# Patient Record
Sex: Female | Born: 1945
Health system: Southern US, Community
[De-identification: ages and names within clinical notes are randomized; demographics above are authoritative.]

## PROBLEM LIST (undated history)

## (undated) DIAGNOSIS — M1990 Unspecified osteoarthritis, unspecified site: Secondary | ICD-10-CM

## (undated) DIAGNOSIS — T7840XA Allergy, unspecified, initial encounter: Secondary | ICD-10-CM

## (undated) DIAGNOSIS — I10 Essential (primary) hypertension: Secondary | ICD-10-CM

## (undated) DIAGNOSIS — H269 Unspecified cataract: Secondary | ICD-10-CM

## (undated) HISTORY — DX: Unspecified osteoarthritis, unspecified site: M19.90

## (undated) HISTORY — PX: ABDOMINAL HYSTERECTOMY: SHX81

## (undated) HISTORY — DX: Essential (primary) hypertension: I10

## (undated) HISTORY — PX: BREAST BIOPSY: SHX20

## (undated) HISTORY — PX: EYE SURGERY: SHX253

## (undated) HISTORY — DX: Allergy, unspecified, initial encounter: T78.40XA

## (undated) HISTORY — DX: Unspecified cataract: H26.9

## (undated) HISTORY — PX: CHOLECYSTECTOMY: SHX55

---

## 2006-01-01 ENCOUNTER — Ambulatory Visit: Payer: Self-pay | Admitting: Family Medicine

## 2006-01-03 ENCOUNTER — Ambulatory Visit: Payer: Self-pay | Admitting: Family Medicine

## 2006-06-17 ENCOUNTER — Ambulatory Visit: Payer: Self-pay | Admitting: Family Medicine

## 2012-04-01 ENCOUNTER — Telehealth (INDEPENDENT_AMBULATORY_CARE_PROVIDER_SITE_OTHER): Payer: Self-pay

## 2012-05-21 NOTE — Telephone Encounter (Signed)
No notes in this encounter. 

## 2015-08-08 ENCOUNTER — Telehealth: Payer: Self-pay

## 2015-08-08 NOTE — Telephone Encounter (Signed)
Pts caregiver called to have us send a D/C order for the Synthroid. I advised her that this was sent to the pharmacy as requested and notified her that she would need to contact the pharmacy.

## 2015-12-08 DIAGNOSIS — M7751 Other enthesopathy of right foot: Secondary | ICD-10-CM | POA: Insufficient documentation

## 2016-06-15 DIAGNOSIS — J309 Allergic rhinitis, unspecified: Secondary | ICD-10-CM | POA: Insufficient documentation

## 2017-07-05 ENCOUNTER — Encounter (HOSPITAL_COMMUNITY): Payer: Self-pay | Admitting: Emergency Medicine

## 2017-07-05 ENCOUNTER — Emergency Department (HOSPITAL_COMMUNITY)
Admission: EM | Admit: 2017-07-05 | Discharge: 2017-07-05 | Disposition: A | Discharge status: Home / Self Care | Payer: No Typology Code available for payment source | Attending: Emergency Medicine | Admitting: Emergency Medicine

## 2017-07-05 ENCOUNTER — Emergency Department (HOSPITAL_COMMUNITY): Payer: No Typology Code available for payment source

## 2017-07-05 DIAGNOSIS — Z79899 Other long term (current) drug therapy: Secondary | ICD-10-CM | POA: Diagnosis not present

## 2017-07-05 DIAGNOSIS — Y9389 Activity, other specified: Secondary | ICD-10-CM | POA: Diagnosis not present

## 2017-07-05 DIAGNOSIS — R51 Headache: Secondary | ICD-10-CM | POA: Diagnosis not present

## 2017-07-05 DIAGNOSIS — Y998 Other external cause status: Secondary | ICD-10-CM | POA: Insufficient documentation

## 2017-07-05 DIAGNOSIS — S2241XA Multiple fractures of ribs, right side, initial encounter for closed fracture: Secondary | ICD-10-CM | POA: Insufficient documentation

## 2017-07-05 DIAGNOSIS — Y92410 Unspecified street and highway as the place of occurrence of the external cause: Secondary | ICD-10-CM | POA: Diagnosis not present

## 2017-07-05 DIAGNOSIS — R1011 Right upper quadrant pain: Secondary | ICD-10-CM | POA: Diagnosis not present

## 2017-07-05 DIAGNOSIS — Z91040 Latex allergy status: Secondary | ICD-10-CM | POA: Insufficient documentation

## 2017-07-05 DIAGNOSIS — Z7982 Long term (current) use of aspirin: Secondary | ICD-10-CM | POA: Diagnosis not present

## 2017-07-05 DIAGNOSIS — S299XXA Unspecified injury of thorax, initial encounter: Secondary | ICD-10-CM | POA: Diagnosis present

## 2017-07-05 DIAGNOSIS — S2221XA Fracture of manubrium, initial encounter for closed fracture: Secondary | ICD-10-CM | POA: Diagnosis not present

## 2017-07-05 DIAGNOSIS — Z9049 Acquired absence of other specified parts of digestive tract: Secondary | ICD-10-CM | POA: Diagnosis not present

## 2017-07-05 LAB — COMPREHENSIVE METABOLIC PANEL
ALK PHOS: 41 U/L (ref 38–126)
ALT: 26 U/L (ref 14–54)
AST: 64 U/L — ABNORMAL HIGH (ref 15–41)
Albumin: 3.2 g/dL — ABNORMAL LOW (ref 3.5–5.0)
Anion gap: 7 (ref 5–15)
BUN: 34 mg/dL — ABNORMAL HIGH (ref 6–20)
CHLORIDE: 108 mmol/L (ref 101–111)
CO2: 24 mmol/L (ref 22–32)
CREATININE: 0.9 mg/dL (ref 0.44–1.00)
Calcium: 8.6 mg/dL — ABNORMAL LOW (ref 8.9–10.3)
GFR calc non Af Amer: >60 mL/min (ref >60)
GLUCOSE: 218 mg/dL — AB (ref 65–99)
Potassium: 4.1 mmol/L (ref 3.5–5.1)
SODIUM: 139 mmol/L (ref 135–145)
Total Bilirubin: 0.7 mg/dL (ref 0.3–1.2)
Total Protein: 6.3 g/dL — ABNORMAL LOW (ref 6.5–8.1)

## 2017-07-05 LAB — CBC WITH DIFFERENTIAL/PLATELET
Abs Immature Granulocytes: 0.1 10*3/uL (ref 0.0–0.1)
BASOS PCT: 0 %
Basophils Absolute: 0 10*3/uL (ref 0.0–0.1)
EOS ABS: 0.1 10*3/uL (ref 0.0–0.7)
EOS PCT: 1 %
HEMATOCRIT: 34.3 % — AB (ref 36.0–46.0)
Hemoglobin: 11.1 g/dL — ABNORMAL LOW (ref 12.0–15.0)
Immature Granulocytes: 1 %
LYMPHS ABS: 1.4 10*3/uL (ref 0.7–4.0)
Lymphocytes Relative: 11 %
MCH: 30.1 pg (ref 26.0–34.0)
MCHC: 32.4 g/dL (ref 30.0–36.0)
MCV: 93 fL (ref 78.0–100.0)
MONOS PCT: 5 %
Monocytes Absolute: 0.7 10*3/uL (ref 0.1–1.0)
NEUTROS PCT: 82 %
Neutro Abs: 9.9 10*3/uL — ABNORMAL HIGH (ref 1.7–7.7)
PLATELETS: 182 10*3/uL (ref 150–400)
RBC: 3.69 MIL/uL — ABNORMAL LOW (ref 3.87–5.11)
RDW: 14.2 % (ref 11.5–15.5)
WBC: 12.2 10*3/uL — ABNORMAL HIGH (ref 4.0–10.5)

## 2017-07-05 LAB — TYPE AND SCREEN
ABO/RH(D): O POS
Antibody Screen: NEGATIVE

## 2017-07-05 LAB — PROTIME-INR
INR: 1.04
Prothrombin Time: 13.5 seconds (ref 11.4–15.2)

## 2017-07-05 LAB — I-STAT CHEM 8, ED
BUN: 37 mg/dL — AB (ref 6–20)
CREATININE: 0.7 mg/dL (ref 0.44–1.00)
Calcium, Ion: 1.12 mmol/L — ABNORMAL LOW (ref 1.15–1.40)
Chloride: 106 mmol/L (ref 101–111)
GLUCOSE: 214 mg/dL — AB (ref 65–99)
HEMATOCRIT: 33 % — AB (ref 36.0–46.0)
Hemoglobin: 11.2 g/dL — ABNORMAL LOW (ref 12.0–15.0)
POTASSIUM: 4 mmol/L (ref 3.5–5.1)
Sodium: 141 mmol/L (ref 135–145)
TCO2: 25 mmol/L (ref 22–32)

## 2017-07-05 LAB — ABO/RH: ABO/RH(D): O POS

## 2017-07-05 MED ORDER — CYCLOBENZAPRINE HCL 10 MG PO TABS: 10.0000 mg | ORAL_TABLET | Freq: Three times a day (TID) | ORAL | 0 refills | Status: DC | PRN

## 2017-07-05 MED ORDER — IOHEXOL 300 MG/ML  SOLN
100.0000 mL | Freq: Once | INTRAMUSCULAR | Status: AC
  Administered 2017-07-05: 100 mL via INTRAVENOUS

## 2017-07-05 MED ORDER — MORPHINE SULFATE (PF) 4 MG/ML IV SOLN
4.0000 mg | Freq: Once | INTRAVENOUS | Status: AC
  Administered 2017-07-05: 4 mg via INTRAVENOUS
  Filled 2017-07-05: qty 1

## 2017-07-05 MED ORDER — OXYCODONE-ACETAMINOPHEN 5-325 MG PO TABS: 1.0000 | ORAL_TABLET | ORAL | 0 refills | Status: DC | PRN

## 2017-07-05 MED ORDER — HYDROMORPHONE HCL 2 MG/ML IJ SOLN: 0.5000 mg | Freq: Once | INTRAMUSCULAR | Status: DC
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
## 2017-07-05 NOTE — ED Notes (Signed)
Patient belongings given to patient family
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
## 2017-07-05 NOTE — ED Notes (Signed)
Patient transported to CT 

## 2017-07-05 NOTE — Discharge Instructions (Addendum)
You have a 1.3 cm RIGHT THYROID NODULE that needs follow up with a thyroid Ultrasound  You have dilation of your COMMONBILE DUCT to 1.6cm and will benefit from GI consultation

## 2017-08-06 DIAGNOSIS — E041 Nontoxic single thyroid nodule: Secondary | ICD-10-CM | POA: Insufficient documentation

## 2018-09-17 ENCOUNTER — Other Ambulatory Visit: Payer: Self-pay

## 2018-09-18 ENCOUNTER — Ambulatory Visit (INDEPENDENT_AMBULATORY_CARE_PROVIDER_SITE_OTHER): Payer: Medicare HMO | Admitting: Physician Assistant

## 2018-09-18 ENCOUNTER — Encounter: Payer: Self-pay | Admitting: Physician Assistant

## 2018-09-18 VITALS — BP 130/78 | HR 84 | Temp 97.1°F | Ht 68.0 in | Wt 163.0 lb

## 2018-09-18 DIAGNOSIS — I10 Essential (primary) hypertension: Secondary | ICD-10-CM

## 2018-09-18 NOTE — Patient Instructions (Signed)

## 2018-09-18 NOTE — Progress Notes (Signed)
  Subjective:     Patient ID: Theresa Gamble, female   DOB: January 10, 1946, 73 y.o.   MRN: [id number]  HPI Pt here as new pt Prev seen by Dr Edrick Oh for HTN States she has been doing well Denies CP,SOB, lower ext edema or change in endurance Has noticed that her BP readings sometimes will be in the sys readings of 90's and at that time she may feel a little lightheaded Currently taking Zestril 5mg  and HCTZ 12.5 mg  Review of Systems  Constitutional: Negative.   Respiratory: Negative.   Cardiovascular: Negative.        Objective:   Physical Exam Vitals signs and nursing note reviewed.  Constitutional:      General: She is not in acute distress.    Appearance: Normal appearance. She is normal weight. She is not ill-appearing.  Neck:     Vascular: No carotid bruit.  Cardiovascular:     Rate and Rhythm: Normal rate and regular rhythm.     Pulses: Normal pulses.     Heart sounds: Normal heart sounds. No murmur.  Pulmonary:     Effort: Pulmonary effort is normal.     Breath sounds: Normal breath sounds.  Musculoskeletal:     Right lower leg: No edema.     Left lower leg: No edema.  Lymphadenopathy:     Cervical: No cervical adenopathy.  Neurological:     Mental Status: She is alert.        Assessment:     1. Hypertension, unspecified type        Plan:     Since she is getting the lower reading with sx will have her stop the HCTZ for now She has a BP cuff at home so would like for her to get regular readings over the next month Hydrate well She is very active around the house so would like for her to keep this up F/U in 4-6 weeks for eval of BP and labs

## 2018-11-06 ENCOUNTER — Other Ambulatory Visit: Payer: Self-pay

## 2018-11-07 ENCOUNTER — Encounter: Payer: Self-pay | Admitting: Family Medicine

## 2018-11-07 ENCOUNTER — Ambulatory Visit (INDEPENDENT_AMBULATORY_CARE_PROVIDER_SITE_OTHER): Payer: Medicare HMO | Admitting: Family Medicine

## 2018-11-07 VITALS — BP 124/79 | HR 93 | Temp 96.9°F | Resp 20 | Ht 68.0 in | Wt 159.6 lb

## 2018-11-07 DIAGNOSIS — R739 Hyperglycemia, unspecified: Secondary | ICD-10-CM | POA: Diagnosis not present

## 2018-11-07 DIAGNOSIS — I10 Essential (primary) hypertension: Secondary | ICD-10-CM

## 2018-11-07 DIAGNOSIS — Z13220 Encounter for screening for lipoid disorders: Secondary | ICD-10-CM

## 2018-11-07 DIAGNOSIS — Z131 Encounter for screening for diabetes mellitus: Secondary | ICD-10-CM

## 2018-11-07 LAB — BAYER DCA HB A1C WAIVED: HB A1C (BAYER DCA - WAIVED): 6 % (ref <7.0)

## 2018-11-07 MED ORDER — HYDROCHLOROTHIAZIDE 12.5 MG PO TABS: 12.5000 mg | ORAL_TABLET | Freq: Every day | ORAL | 3 refills | Status: DC

## 2018-11-07 NOTE — Progress Notes (Signed)
BP 124/79   Pulse 93   Temp (!) 96.9 F (36.1 C) (Temporal)   Resp 20   Ht '5\' 8"'$  (1.727 m)   Wt 159 lb 9.6 oz (72.4 kg)   SpO2 99%   BMI 24.27 kg/m    Subjective:   Patient ID: Theresa Gamble, female    DOB: 06/24/1945, 73 y.o.   MRN: [id number]  HPI: Theresa Gamble is a 73 y.o. female presenting on 11/07/2018 for Hypertension (six week recheck)   HPI Hypertension Patient is currently on hydrochlorothiazide and lisinopril, her blood pressures are running on the lower end, she is getting some lightheadedness and dizziness, she was instructed on her last visit to stop hydrochlorothiazide but that she got swelling so she started it back up a week ago, and their blood pressure today is 124/79.Patient denies headaches, blurred vision, chest pains, shortness of breath, or weakness. Denies any side effects from medication and is content with current medication.   Relevant past medical, surgical, family and social history reviewed and updated as indicated. Interim medical history since our last visit reviewed. Allergies and medications reviewed and updated.  Review of Systems  Constitutional: Negative for chills and fever.  HENT: Negative for congestion, ear discharge and ear pain.   Eyes: Negative for visual disturbance.  Respiratory: Negative for chest tightness and shortness of breath.   Cardiovascular: Negative for chest pain and leg swelling.  Genitourinary: Negative for difficulty urinating and dysuria.  Musculoskeletal: Negative for back pain and gait problem.  Skin: Negative for rash.  Neurological: Positive for dizziness and light-headedness. Negative for headaches.  Psychiatric/Behavioral: Negative for agitation and behavioral problems.  All other systems reviewed and are negative.   Per HPI unless specifically indicated above   Allergies as of 11/07/2018      Reactions   Latex Rash      Medication List       Accurate as of November 07, 2018 11:25 AM. If you have  any questions, ask your nurse or doctor.        STOP taking these medications   lisinopril 5 MG tablet Commonly known as: ZESTRIL Stopped by: Fransisca Kaufmann Tiffney Haughton, MD     TAKE these medications   aspirin EC 81 MG tablet Take 81 mg by mouth daily.   calcium carbonate 1500 (600 Ca) MG Tabs tablet Commonly known as: OSCAL Take 600 mg of elemental calcium by mouth daily with breakfast.   hydrochlorothiazide 12.5 MG tablet Commonly known as: HYDRODIURIL Take 1 tablet (12.5 mg total) by mouth daily. What changed:   how much to take  when to take this Changed by: Worthy Rancher, MD   multivitamin tablet Take 1 tablet by mouth daily.        Objective:   BP 124/79   Pulse 93   Temp (!) 96.9 F (36.1 C) (Temporal)   Resp 20   Ht '5\' 8"'$  (1.727 m)   Wt 159 lb 9.6 oz (72.4 kg)   SpO2 99%   BMI 24.27 kg/m   Wt Readings from Last 3 Encounters:  11/07/18 159 lb 9.6 oz (72.4 kg)  09/18/18 163 lb (73.9 kg)  07/05/17 158 lb (71.7 kg)    Physical Exam Vitals signs and nursing note reviewed.  Constitutional:      General: She is not in acute distress.    Appearance: She is well-developed. She is not diaphoretic.  Eyes:     Conjunctiva/sclera: Conjunctivae normal.  Cardiovascular:     Rate  and Rhythm: Normal rate and regular rhythm.     Heart sounds: Normal heart sounds. No murmur.  Pulmonary:     Effort: Pulmonary effort is normal. No respiratory distress.     Breath sounds: Normal breath sounds. No wheezing.  Musculoskeletal: Normal range of motion.        General: No tenderness.  Skin:    General: Skin is warm and dry.     Findings: No rash.  Neurological:     Mental Status: She is alert and oriented to person, place, and time.     Coordination: Coordination normal.  Psychiatric:        Behavior: Behavior normal.     Assessment & Plan:   Problem List Items Addressed This Visit      Cardiovascular and Mediastinum   Hypertension   Relevant Medications    hydrochlorothiazide (HYDRODIURIL) 12.5 MG tablet   Other Relevant Orders   CMP14+EGFR   CBC with Differential/Platelet    Other Visit Diagnoses    Diabetes mellitus screening    -  Primary   Relevant Orders   Bayer DCA Hb A1c Waived   CBC with Differential/Platelet   Lipid screening       Relevant Orders   Lipid panel      Stop lisinopril and keep hydrochlorothiazide because patient prefers to have it because of swelling that she gets sometimes Follow up plan: Return in about 6 weeks (around 12/19/2018), or if symptoms worsen or fail to improve, for Hypertension recheck.  Counseling provided for all of the vaccine components Orders Placed This Encounter  Procedures  . Bayer DCA Hb A1c Waived  . CMP14+EGFR  . CBC with Differential/Platelet  . Lipid panel    Caryl Pina, MD [facility] 11/07/2018, 11:25 AM

## 2018-11-08 LAB — CBC WITH DIFFERENTIAL/PLATELET
Basophils Absolute: 0 10*3/uL (ref 0.0–0.2)
Basos: 1 %
EOS (ABSOLUTE): 0.1 10*3/uL (ref 0.0–0.4)
Eos: 3 %
Hematocrit: 37.6 % (ref 34.0–46.6)
Hemoglobin: 12.7 g/dL (ref 11.1–15.9)
Immature Grans (Abs): 0 10*3/uL (ref 0.0–0.1)
Immature Granulocytes: 0 %
Lymphocytes Absolute: 1.6 10*3/uL (ref 0.7–3.1)
Lymphs: 52 %
MCH: 31 pg (ref 26.6–33.0)
MCHC: 33.8 g/dL (ref 31.5–35.7)
MCV: 92 fL (ref 79–97)
Monocytes Absolute: 0.5 10*3/uL (ref 0.1–0.9)
Monocytes: 14 %
Neutrophils Absolute: 0.9 10*3/uL — ABNORMAL LOW (ref 1.4–7.0)
Neutrophils: 30 %
Platelets: 265 10*3/uL (ref 150–450)
RBC: 4.1 x10E6/uL (ref 3.77–5.28)
RDW: 12.7 % (ref 11.7–15.4)
WBC: 3.1 10*3/uL — ABNORMAL LOW (ref 3.4–10.8)

## 2018-11-08 LAB — LIPID PANEL
Chol/HDL Ratio: 2.6 ratio (ref 0.0–4.4)
Cholesterol, Total: 180 mg/dL (ref 100–199)
HDL: 70 mg/dL (ref >39)
LDL Chol Calc (NIH): 99 mg/dL (ref 0–99)
Triglycerides: 59 mg/dL (ref 0–149)
VLDL Cholesterol Cal: 11 mg/dL (ref 5–40)

## 2018-11-08 LAB — CMP14+EGFR
ALT: 9 IU/L (ref 0–32)
AST: 18 IU/L (ref 0–40)
Albumin/Globulin Ratio: 1.4 (ref 1.2–2.2)
Albumin: 4.3 g/dL (ref 3.7–4.7)
Alkaline Phosphatase: 59 IU/L (ref 39–117)
BUN/Creatinine Ratio: 29 — ABNORMAL HIGH (ref 12–28)
BUN: 22 mg/dL (ref 8–27)
Bilirubin Total: 0.2 mg/dL (ref 0.0–1.2)
CO2: 22 mmol/L (ref 20–29)
Calcium: 9.9 mg/dL (ref 8.7–10.3)
Chloride: 103 mmol/L (ref 96–106)
Creatinine, Ser: 0.75 mg/dL (ref 0.57–1.00)
GFR calc Af Amer: 93 mL/min/{1.73_m2} (ref >59)
GFR calc non Af Amer: 80 mL/min/{1.73_m2} (ref >59)
Globulin, Total: 3.1 g/dL (ref 1.5–4.5)
Glucose: 105 mg/dL — ABNORMAL HIGH (ref 65–99)
Potassium: 4.8 mmol/L (ref 3.5–5.2)
Sodium: 140 mmol/L (ref 134–144)
Total Protein: 7.4 g/dL (ref 6.0–8.5)

## 2018-12-22 ENCOUNTER — Ambulatory Visit: Payer: Self-pay | Admitting: Family Medicine

## 2018-12-25 ENCOUNTER — Other Ambulatory Visit: Payer: Self-pay

## 2018-12-26 ENCOUNTER — Ambulatory Visit (INDEPENDENT_AMBULATORY_CARE_PROVIDER_SITE_OTHER): Payer: Medicare HMO | Admitting: Family Medicine

## 2018-12-26 ENCOUNTER — Encounter: Payer: Self-pay | Admitting: Family Medicine

## 2018-12-26 VITALS — BP 137/86 | HR 86 | Temp 95.9°F | Ht 68.0 in | Wt 162.2 lb

## 2018-12-26 DIAGNOSIS — I10 Essential (primary) hypertension: Secondary | ICD-10-CM

## 2018-12-26 DIAGNOSIS — E041 Nontoxic single thyroid nodule: Secondary | ICD-10-CM

## 2018-12-26 DIAGNOSIS — R7989 Other specified abnormal findings of blood chemistry: Secondary | ICD-10-CM | POA: Diagnosis not present

## 2018-12-26 NOTE — Progress Notes (Signed)
BP 137/86   Pulse 86   Temp (!) 95.9 F (35.5 C) (Temporal)   Ht _0  (1.727 m)   Wt 162 lb 3.2 oz (73.6 kg)   SpO2 94%   BMI 24.66 kg/m    Subjective:   Patient ID: Theresa Gamble, female    DOB: Nov 13, 1945, 73 y.o.   MRN: [id number]  HPI: Theresa Gamble is a 73 y.o. female presenting on 12/26/2018 for Hypertension (6 week re check )   HPI Hypertension Patient is currently on hydrochlorothiazide, and their blood pressure today is 137/86 and at home it looks like she is mostly running in the low teens over 60s. Patient denies any lightheadedness or dizziness. Patient denies headaches, blurred vision, chest pains, shortness of breath, or weakness. Denies any side effects from medication and is content with current medication.   Has a history of a thyroid nodule, will recheck thyroid levels today, thyroid nodule is not palpable on exam  Relevant past medical, surgical, family and social history reviewed and updated as indicated. Interim medical history since our last visit reviewed. Allergies and medications reviewed and updated.  Review of Systems  Constitutional: Negative for chills and fever.  HENT: Negative for congestion, ear discharge and ear pain.   Eyes: Negative for redness and visual disturbance.  Respiratory: Negative for chest tightness and shortness of breath.   Cardiovascular: Negative for chest pain and leg swelling.  Musculoskeletal: Negative for back pain and gait problem.  Skin: Negative for rash.  Neurological: Negative for light-headedness and headaches.  Psychiatric/Behavioral: Negative for agitation and behavioral problems.  All other systems reviewed and are negative.   Per HPI unless specifically indicated above   Allergies as of 12/26/2018      Reactions   Latex Rash      Medication List       Accurate as of December 26, 2018 11:32 AM. If you have any questions, ask your nurse or doctor.        aspirin EC 81 MG tablet Take 81 mg by mouth  daily.   calcium carbonate 1500 (600 Ca) MG Tabs tablet Commonly known as: OSCAL Take 600 mg of elemental calcium by mouth daily with breakfast.   hydrochlorothiazide 12.5 MG tablet Commonly known as: HYDRODIURIL Take 1 tablet (12.5 mg total) by mouth daily.   multivitamin tablet Take 1 tablet by mouth daily.        Objective:   BP 137/86   Pulse 86   Temp (!) 95.9 F (35.5 C) (Temporal)   Ht _1  (1.727 m)   Wt 162 lb 3.2 oz (73.6 kg)   SpO2 94%   BMI 24.66 kg/m   Wt Readings from Last 3 Encounters:  12/26/18 162 lb 3.2 oz (73.6 kg)  11/07/18 159 lb 9.6 oz (72.4 kg)  09/18/18 163 lb (73.9 kg)    Physical Exam Vitals signs and nursing note reviewed.  Constitutional:      General: She is not in acute distress.    Appearance: She is well-developed. She is not diaphoretic.  Eyes:     Conjunctiva/sclera: Conjunctivae normal.  Cardiovascular:     Rate and Rhythm: Normal rate and regular rhythm.     Heart sounds: Normal heart sounds. No murmur.  Pulmonary:     Effort: Pulmonary effort is normal. No respiratory distress.     Breath sounds: Normal breath sounds. No wheezing.  Musculoskeletal: Normal range of motion.        General: No tenderness.  Skin:    General: Skin is warm and dry.     Findings: No rash.  Neurological:     Mental Status: She is alert and oriented to person, place, and time.     Coordination: Coordination normal.  Psychiatric:        Behavior: Behavior normal.     Results for orders placed or performed in visit on 11/07/18  Bayer DCA Hb A1c Waived  Result Value Ref Range   HB A1C (BAYER DCA - WAIVED) 6.0 <7.0 %  CMP14+EGFR  Result Value Ref Range   Glucose 105 (H) 65 - 99 mg/dL   BUN 22 8 - 27 mg/dL   Creatinine, Ser 0.75 0.57 - 1.00 mg/dL   GFR calc non Af Amer 80 >59 mL/min/1.73   GFR calc Af Amer 93 >59 mL/min/1.73   BUN/Creatinine Ratio 29 (H) 12 - 28   Sodium 140 134 - 144 mmol/L   Potassium 4.8 3.5 - 5.2 mmol/L   Chloride  103 96 - 106 mmol/L   CO2 22 20 - 29 mmol/L   Calcium 9.9 8.7 - 10.3 mg/dL   Total Protein 7.4 6.0 - 8.5 g/dL   Albumin 4.3 3.7 - 4.7 g/dL   Globulin, Total 3.1 1.5 - 4.5 g/dL   Albumin/Globulin Ratio 1.4 1.2 - 2.2   Bilirubin Total 0.2 0.0 - 1.2 mg/dL   Alkaline Phosphatase 59 39 - 117 IU/L   AST 18 0 - 40 IU/L   ALT 9 0 - 32 IU/L  CBC with Differential/Platelet  Result Value Ref Range   WBC 3.1 (L) 3.4 - 10.8 x10E3/uL   RBC 4.10 3.77 - 5.28 x10E6/uL   Hemoglobin 12.7 11.1 - 15.9 g/dL   Hematocrit 37.6 34.0 - 46.6 %   MCV 92 79 - 97 fL   MCH 31.0 26.6 - 33.0 pg   MCHC 33.8 31.5 - 35.7 g/dL   RDW 12.7 11.7 - 15.4 %   Platelets 265 150 - 450 x10E3/uL   Neutrophils 30 Not Estab. %   Lymphs 52 Not Estab. %   Monocytes 14 Not Estab. %   Eos 3 Not Estab. %   Basos 1 Not Estab. %   Neutrophils Absolute 0.9 (L) 1.4 - 7.0 x10E3/uL   Lymphocytes Absolute 1.6 0.7 - 3.1 x10E3/uL   Monocytes Absolute 0.5 0.1 - 0.9 x10E3/uL   EOS (ABSOLUTE) 0.1 0.0 - 0.4 x10E3/uL   Basophils Absolute 0.0 0.0 - 0.2 x10E3/uL   Immature Granulocytes 0 Not Estab. %   Immature Grans (Abs) 0.0 0.0 - 0.1 x10E3/uL   Hematology Comments: Note:   Lipid panel  Result Value Ref Range   Cholesterol, Total 180 100 - 199 mg/dL   Triglycerides 59 0 - 149 mg/dL   HDL 70 >39 mg/dL   VLDL Cholesterol Cal 11 5 - 40 mg/dL   LDL Chol Calc (NIH) 99 0 - 99 mg/dL   Chol/HDL Ratio 2.6 0.0 - 4.4 ratio    Assessment & Plan:   Problem List Items Addressed This Visit      Cardiovascular and Mediastinum   Hypertension   Relevant Orders   BMP8+EGFR     Endocrine   Right thyroid nodule - Primary   Relevant Orders   TSH    Continue current medication, will check thyroid as well.  Follow up plan: Return in about 6 months (around 06/25/2019), or if symptoms worsen or fail to improve, for Hypertension.  Counseling provided for all of the vaccine components  Orders Placed This Encounter  Procedures  . TSH  . BMP8+EGFR     Caryl Pina, MD [facility] 12/26/2018, 11:32 AM

## 2018-12-27 LAB — BMP8+EGFR
BUN/Creatinine Ratio: 32 — ABNORMAL HIGH (ref 12–28)
BUN: 20 mg/dL (ref 8–27)
CO2: 25 mmol/L (ref 20–29)
Calcium: 10.4 mg/dL — ABNORMAL HIGH (ref 8.7–10.3)
Chloride: 101 mmol/L (ref 96–106)
Creatinine, Ser: 0.62 mg/dL (ref 0.57–1.00)
GFR calc Af Amer: 105 mL/min/{1.73_m2} (ref >59)
GFR calc non Af Amer: 91 mL/min/{1.73_m2} (ref >59)
Glucose: 89 mg/dL (ref 65–99)
Potassium: 5 mmol/L (ref 3.5–5.2)
Sodium: 141 mmol/L (ref 134–144)

## 2018-12-27 LAB — TSH: TSH: 4.63 u[IU]/mL — ABNORMAL HIGH (ref 0.450–4.500)

## 2018-12-31 NOTE — Addendum Note (Signed)
Addended by: Karle Plumber on: 12/31/2018 03:13 PM   Modules accepted: Orders

## 2019-01-02 ENCOUNTER — Other Ambulatory Visit: Payer: Self-pay

## 2019-01-02 DIAGNOSIS — Z20822 Contact with and (suspected) exposure to covid-19: Secondary | ICD-10-CM

## 2019-01-02 DIAGNOSIS — Z20828 Contact with and (suspected) exposure to other viral communicable diseases: Secondary | ICD-10-CM | POA: Diagnosis not present

## 2019-01-05 LAB — NOVEL CORONAVIRUS, NAA: SARS-CoV-2, NAA: NOT DETECTED

## 2019-01-13 ENCOUNTER — Ambulatory Visit: Payer: Medicare HMO

## 2019-01-13 DIAGNOSIS — Z1231 Encounter for screening mammogram for malignant neoplasm of breast: Secondary | ICD-10-CM | POA: Diagnosis not present

## 2019-01-14 DIAGNOSIS — M9902 Segmental and somatic dysfunction of thoracic region: Secondary | ICD-10-CM | POA: Diagnosis not present

## 2019-01-14 DIAGNOSIS — M9901 Segmental and somatic dysfunction of cervical region: Secondary | ICD-10-CM | POA: Diagnosis not present

## 2019-01-14 DIAGNOSIS — M62830 Muscle spasm of back: Secondary | ICD-10-CM | POA: Diagnosis not present

## 2019-01-14 DIAGNOSIS — M9903 Segmental and somatic dysfunction of lumbar region: Secondary | ICD-10-CM | POA: Diagnosis not present

## 2019-01-19 ENCOUNTER — Ambulatory Visit: Payer: Medicare HMO

## 2019-01-28 ENCOUNTER — Other Ambulatory Visit: Payer: Medicare HMO

## 2019-01-28 ENCOUNTER — Telehealth: Payer: Self-pay

## 2019-01-28 ENCOUNTER — Other Ambulatory Visit: Payer: Self-pay

## 2019-01-28 DIAGNOSIS — R7989 Other specified abnormal findings of blood chemistry: Secondary | ICD-10-CM | POA: Diagnosis not present

## 2019-01-28 DIAGNOSIS — M9902 Segmental and somatic dysfunction of thoracic region: Secondary | ICD-10-CM | POA: Diagnosis not present

## 2019-01-28 DIAGNOSIS — M62830 Muscle spasm of back: Secondary | ICD-10-CM | POA: Diagnosis not present

## 2019-01-28 DIAGNOSIS — M9901 Segmental and somatic dysfunction of cervical region: Secondary | ICD-10-CM | POA: Diagnosis not present

## 2019-01-28 DIAGNOSIS — M9903 Segmental and somatic dysfunction of lumbar region: Secondary | ICD-10-CM | POA: Diagnosis not present

## 2019-01-28 DIAGNOSIS — I10 Essential (primary) hypertension: Secondary | ICD-10-CM | POA: Diagnosis not present

## 2019-01-28 NOTE — Telephone Encounter (Signed)
Patient came in today for labs and was asking about Mammogram results.  Patient had exam done with [facility] unit 01/13/2019 - our office has not received results at this time.  I reached out to [facility] records and per their office they are waiting of outside films - no report available at this time.  Called patient and notified. MPulliam, CMA/RT(R)

## 2019-01-29 LAB — TSH: TSH: 3.31 u[IU]/mL (ref 0.450–4.500)

## 2019-02-04 ENCOUNTER — Ambulatory Visit (INDEPENDENT_AMBULATORY_CARE_PROVIDER_SITE_OTHER): Payer: Medicare HMO | Admitting: *Deleted

## 2019-02-04 DIAGNOSIS — Z0000 Encounter for general adult medical examination without abnormal findings: Secondary | ICD-10-CM | POA: Diagnosis not present

## 2019-02-04 NOTE — Patient Instructions (Signed)
Preventive Care 71 Years and Older, Female Preventive care refers to lifestyle choices and visits with your health care provider that can promote health and wellness. This includes:  A yearly physical exam. This is also called an annual well check.  Regular dental and eye exams.  Immunizations.  Screening for certain conditions.  Healthy lifestyle choices, such as diet and exercise. What can I expect for my preventive care visit? Physical exam Your health care provider will check:  Height and weight. These may be used to calculate body mass index (BMI), which is a measurement that tells if you are at a healthy weight.  Heart rate and blood pressure.  Your skin for abnormal spots. Counseling Your health care provider may ask you questions about:  Alcohol, tobacco, and drug use.  Emotional well-being.  Home and relationship well-being.  Sexual activity.  Eating habits.  History of falls.  Memory and ability to understand (cognition).  Work and work Statistician.  Pregnancy and menstrual history. What immunizations do I need?  Influenza (flu) vaccine  This is recommended every year. Tetanus, diphtheria, and pertussis (Tdap) vaccine  You may need a Td booster every 10 years. Varicella (chickenpox) vaccine  You may need this vaccine if you have not already been vaccinated. Zoster (shingles) vaccine  You may need this after age 71. Pneumococcal conjugate (PCV13) vaccine  One dose is recommended after age 71. Pneumococcal polysaccharide (PPSV23) vaccine  One dose is recommended after age 71. Measles, mumps, and rubella (MMR) vaccine  You may need at least one dose of MMR if you were born in 1957 or later. You may also need a second dose. Meningococcal conjugate (MenACWY) vaccine  You may need this if you have certain conditions. Hepatitis A vaccine  You may need this if you have certain conditions or if you travel or work in places where you may be exposed  to hepatitis A. Hepatitis B vaccine  You may need this if you have certain conditions or if you travel or work in places where you may be exposed to hepatitis B. Haemophilus influenzae type b (Hib) vaccine  You may need this if you have certain conditions. You may receive vaccines as individual doses or as more than one vaccine together in one shot (combination vaccines). Talk with your health care provider about the risks and benefits of combination vaccines. What tests do I need? Blood tests  Lipid and cholesterol levels. These may be checked every 5 years, or more frequently depending on your overall health.  Hepatitis C test.  Hepatitis B test. Screening  Lung cancer screening. You may have this screening every year starting at age 71 if you have a 30-pack-year history of smoking and currently smoke or have quit within the past 15 years.  Colorectal cancer screening. All adults should have this screening starting at age 71 and continuing until age 15. Your health care provider may recommend screening at age 71 if you are at increased risk. You will have tests every 1-10 years, depending on your results and the type of screening test.  Diabetes screening. This is done by checking your blood sugar (glucose) after you have not eaten for a while (fasting). You may have this done every 1-3 years.  Mammogram. This may be done every 1-2 years. Talk with your health care provider about how often you should have regular mammograms.  BRCA-related cancer screening. This may be done if you have a family history of breast, ovarian, tubal, or peritoneal cancers.  Other tests  Sexually transmitted disease (STD) testing.  Bone density scan. This is done to screen for osteoporosis. You may have this done starting at age 71. Follow these instructions at home: Eating and drinking  Eat a diet that includes fresh fruits and vegetables, whole grains, lean protein, and low-fat dairy products. Limit  your intake of foods with high amounts of sugar, saturated fats, and salt.  Take vitamin and mineral supplements as recommended by your health care provider.  Do not drink alcohol if your health care provider tells you not to drink.  If you drink alcohol: ? Limit how much you have to 0-1 drink a day. ? Be aware of how much alcohol is in your drink. In the [place] one drink equals one 12 oz bottle of beer (355 mL), one 5 oz glass of wine (148 mL), or one 1 oz glass of hard liquor (44 mL). Lifestyle  Take daily care of your teeth and gums.  Stay active. Exercise for at least 30 minutes on 5 or more days each week.  Do not use any products that contain nicotine or tobacco, such as cigarettes, e-cigarettes, and chewing tobacco. If you need help quitting, ask your health care provider.  If you are sexually active, practice safe sex. Use a condom or other form of protection in order to prevent STIs (sexually transmitted infections).  Talk with your health care provider about taking a low-dose aspirin or statin. What's next?  Go to your health care provider once a year for a well check visit.  Ask your health care provider how often you should have your eyes and teeth checked.  Stay up to date on all vaccines. This information is not intended to replace advice given to you by your health care provider. Make sure you discuss any questions you have with your health care provider. Document Released: 03/04/2015 Document Revised: 01/30/2018 Document Reviewed: 01/30/2018 Elsevier Patient Education  2020 [employer].

## 2019-02-04 NOTE — Progress Notes (Addendum)
MEDICARE ANNUAL WELLNESS VISIT  02/04/2019  Telephone Visit Disclaimer This Medicare AWV was conducted by telephone due to national recommendations for restrictions regarding the COVID-19 Pandemic (e.g. social distancing).  I verified, using two identifiers, that I am speaking with [facility] or their authorized healthcare agent. I discussed the limitations, risks, security, and privacy concerns of performing an evaluation and management service by telephone and the potential availability of an in-person appointment in the future. The patient expressed understanding and agreed to proceed.   Subjective:  Theresa Gamble is a 73 y.o. female patient of Dettinger, Fransisca Kaufmann, MD who had a Medicare Annual Wellness Visit today via telephone. Theresa Gamble is Retired from [employer] after working for 71 years and lives with their spouse. she has 2 children. she reports that she is socially active and does interact with friends/family regularly. she is markedly physically active and enjoys walking with her daughter, spending time with her family and working in the yard with her husband.  Patient Care Team: Dettinger, Fransisca Kaufmann, MD as PCP - General (Family Medicine)  Advanced Directives 02/04/2019  Does Patient Have a Medical Advance Directive? No  Would patient like information on creating a medical advance directive? No - Patient declined    Hospital Utilization Over the Past 12 Months: # of hospitalizations or ER visits: 0 # of surgeries: 0  Review of Systems    Patient reports that her overall health is unchanged compared to last year.  History obtained from chart review  Patient Reported Readings (BP, Pulse, CBG, Weight, etc) none  Pain Assessment Pain : No/denies pain     Current Medications & Allergies (verified) Allergies as of 02/04/2019       Reactions   Latex Rash        Medication List        Accurate as of February 04, 2019  8:48 AM. If you have any questions, ask your  nurse or doctor.          aspirin EC 81 MG tablet Take 81 mg by mouth daily.   calcium carbonate 1500 (600 Ca) MG Tabs tablet Commonly known as: OSCAL Take 600 mg of elemental calcium by mouth daily with breakfast.   hydrochlorothiazide 12.5 MG tablet Commonly known as: HYDRODIURIL Take 1 tablet (12.5 mg total) by mouth daily.   multivitamin tablet Take 1 tablet by mouth daily.        History (reviewed): Past Medical History:  Diagnosis Date   Hypertension    Past Surgical History:  Procedure Laterality Date   ABDOMINAL HYSTERECTOMY     CHOLECYSTECTOMY     EYE SURGERY Bilateral 1990   Family History  Problem Relation Age of Onset   Heart disease Mother    Diabetes Mother    Stroke Father    Social History   Socioeconomic History   Marital status: Married    Spouse name: Earnie Larsson   Number of children: 2   Years of education: 12   Highest education level: High school graduate  Occupational History   Occupation: retired    Fish farm manager: [employer]    Comment: 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  Tobacco Use   Smoking status: Never Smoker   Smokeless tobacco: Never Used  Substance and Sexual Activity   Alcohol use: Never   Drug use: Never   Sexual activity: Yes    Birth control/protection: Surgical  Other Topics Concern   Not on file  Social History Narrative   Not on file  Social Determinants of Health   Financial Resource Strain: Low Risk    Difficulty of Paying Living Expenses: Not hard at all  Food Insecurity: No Food Insecurity   Worried About Programme researcher, broadcasting/film/video in the Last Year: Never true   Ran Out of Food in the Last Year: Never true  Transportation Needs: No Transportation Needs   Lack of Transportation (Medical): No   Lack of Transportation (Non-Medical): No  Physical Activity: Sufficiently Active   Days of Exercise per Week: 6 days   Minutes of Exercise per Session: 40 min  Stress: No Stress Concern Present   Feeling of Stress : Not at all  Social  Connections: Not Isolated   Frequency of Communication with Friends and Family: More than three times a week   Frequency of Social Gatherings with Friends and Family: More than three times a week   Attends Religious Services: More than 4 times per year   Active Member of [employer] or Organizations: Yes   Attends Banker Meetings: More than 4 times per year   Marital Status: Married    Activities of Daily Living In your present state of health, do you have any difficulty performing the following activities: 02/04/2019  Hearing? Y  Comment has trouble hearing when on the phone-she has thought about the possibility of getting hearing aids  Vision? N  Comment wears reading glasses-gets yearly eye exam  Difficulty concentrating or making decisions? N  Walking or climbing stairs? N  Dressing or bathing? N  Doing errands, shopping? N  Preparing Food and eating ? N  Using the Toilet? N  In the past six months, have you accidently leaked urine? N  Do you have problems with loss of bowel control? N  Managing your Medications? N  Managing your Finances? N  Housekeeping or managing your Housekeeping? N  Some recent data might be hidden    Patient Education/ Literacy How often do you need to have someone help you when you read instructions, pamphlets, or other written materials from your doctor or pharmacy?: 1 - Never What is the last grade level you completed in school?: 12th grade  Exercise Current Exercise Habits: Home exercise routine, Time (Minutes): 40, Frequency (Times/Week): 6, Weekly Exercise (Minutes/Week): 240, Intensity: Moderate, Exercise limited by: None identified  Diet Patient reports consuming 2 meals a day and 1 snack(s) a day Patient reports that her primary diet is: Regular Patient reports that she does have regular access to food.   Depression Screen PHQ 2/9 Scores 02/04/2019 12/26/2018 11/07/2018 09/18/2018  PHQ - 2 Score 0 0 1 0  PHQ- 9 Score - - - 0      Fall Risk Fall Risk  02/04/2019 12/26/2018 11/07/2018 09/18/2018  Falls in the past year? 0 0 0 1  Number falls in past yr: - - - 0  Injury with Fall? - - - 1     Objective:  Theresa Gamble seemed alert and oriented and she participated appropriately during our telephone visit.  Blood Pressure Weight BMI  BP Readings from Last 3 Encounters:  12/26/18 137/86  11/07/18 124/79  09/18/18 130/78   Wt Readings from Last 3 Encounters:  12/26/18 162 lb 3.2 oz (73.6 kg)  11/07/18 159 lb 9.6 oz (72.4 kg)  09/18/18 163 lb (73.9 kg)   BMI Readings from Last 1 Encounters:  12/26/18 24.66 kg/m    *Unable to obtain current vital signs, weight, and BMI due to telephone visit type  Hearing/Vision  Theresa Gamble did not seem to have difficulty with hearing/understanding during the telephone conversation Reports that she has had a formal eye exam by an eye care professional within the past year Reports that she has not had a formal hearing evaluation within the past year *Unable to fully assess hearing and vision during telephone visit type  Cognitive Function: 6CIT Screen 02/04/2019  What Year? 0 points  What month? 0 points  What time? 0 points  Count back from 20 0 points  Months in reverse 0 points  Repeat phrase 0 points  Total Score 0   (Normal:0-7, Significant for Dysfunction: >8)  Normal Cognitive Function Screening: Yes   Immunization & Health Maintenance Record Immunization History  Administered Date(s) Administered   Influenza, High Dose Seasonal PF 12/06/2014, 11/30/2015, 11/19/2017, 10/28/2018   Influenza-Unspecified 12/01/2013, 11/20/2016   Pneumococcal Conjugate-13 02/17/2015, 12/18/2016   Pneumococcal Polysaccharide-23 12/01/2013, 10/28/2018   Tdap 12/18/2016   Zoster 12/05/2013   Zoster Recombinat (Shingrix) 06/11/2017, 08/13/2017    Health Maintenance  Topic Date Due   DEXA SCAN  12/26/2019 (Originally 03/31/2012)   COLONOSCOPY  12/26/2019 (Originally 03/31/1997)    Hepatitis C Screening  12/26/2019 (Originally [birth year])   MAMMOGRAM  01/12/2021   TETANUS/TDAP  12/19/2026   INFLUENZA VACCINE  Completed   PNA vac Low Risk Adult  Completed       Assessment  This is a routine wellness examination for Theresa Gamble.  Health Maintenance: Due or Overdue There are no preventive care reminders to display for this patient.  Theresa Gamble does not need a referral for [employer] Assistance: Care Management:   no Social Work:    no Prescription Assistance:  no Nutrition/Diabetes Education:  no   Plan:  Personalized Goals Goals Addressed             This Visit's Progress    DIET - INCREASE WATER INTAKE       Try to drink 6-8 glasses of water daily       Personalized Health Maintenance & Screening Recommendations  Bone densitometry screening  Lung Cancer Screening Recommended: no (Low Dose CT Chest recommended if Age 71-80 years, 30 pack-year currently smoking OR have quit w/in past 15 years) Hepatitis C Screening recommended: no HIV Screening recommended: no  Advanced Directives: Written information was not prepared per patient's request.  Referrals & Orders No orders of the defined types were placed in this encounter.   Follow-up Plan Follow-up with Dettinger, Elige RadonJoshua A, MD as planned Schedule your DEXA scan when you come in for your next visit with your PCP   I have personally reviewed and noted the following in the patient's chart:   Medical and social history Use of alcohol, tobacco or illicit drugs  Current medications and supplements Functional ability and status Nutritional status Physical activity Advanced directives List of other physicians Hospitalizations, surgeries, and ER visits in previous 12 months Vitals Screenings to include cognitive, depression, and falls Referrals and appointments  In addition, I have reviewed and discussed with [facility] certain preventive protocols, quality metrics, and best practice  recommendations. A written personalized care plan for preventive services as well as general preventive health recommendations is available and can be mailed to the patient at her request.      Erving Sassano, Vivien RossettiKarla G, LPN  [id number]/119112/16/2020   I have reviewed and agree with the above AWV documentation.    Jannifer Rodneyhristy Hawks, FNP

## 2019-03-08 ENCOUNTER — Ambulatory Visit: Payer: Medicare Other | Attending: Internal Medicine

## 2019-03-08 DIAGNOSIS — Z23 Encounter for immunization: Secondary | ICD-10-CM | POA: Insufficient documentation

## 2019-03-08 NOTE — Progress Notes (Signed)
   Covid-19 Vaccination Clinic  Name:  Siniya Lichty    MRN: [id number] DOB: Jul 29, 1945  03/08/2019  Ms. Wahlstrom was observed post Covid-19 immunization for 15 mins without incidence. She was provided with Vaccine Information Sheet and instruction to access the V-Safe system.   Ms. Menon was instructed to call 911 with any severe reactions post vaccine: Marland Kitchen Difficulty breathing  . Swelling of your face and throat  . A fast heartbeat  . A bad rash all over your body  . Dizziness and weakness

## 2019-03-26 ENCOUNTER — Ambulatory Visit: Payer: Medicare HMO | Attending: Internal Medicine

## 2019-03-26 DIAGNOSIS — Z23 Encounter for immunization: Secondary | ICD-10-CM | POA: Insufficient documentation

## 2019-03-26 NOTE — Progress Notes (Signed)
   Covid-19 Vaccination Clinic  Name:  Theresa Gamble    MRN: [id number] DOB: 1945/12/01  03/26/2019  Theresa Gamble was observed post Covid-19 immunization for 15 minutes without incidence. She was provided with Vaccine Information Sheet and instruction to access the V-Safe system.   Theresa Gamble was instructed to call 911 with any severe reactions post vaccine: Marland Kitchen Difficulty breathing  . Swelling of your face and throat  . A fast heartbeat  . A bad rash all over your body  . Dizziness and weakness    Immunizations Administered    Name Date Dose VIS Date Route   Pfizer COVID-19 Vaccine 03/26/2019  8:53 AM 0.3 mL 01/30/2019 Intramuscular   Manufacturer: [employer], [employer]   Lot: [id number]   NDC: [address]-6

## 2019-03-28 ENCOUNTER — Ambulatory Visit: Payer: Medicare HMO

## 2019-03-29 ENCOUNTER — Ambulatory Visit: Payer: Medicare HMO

## 2019-06-01 DIAGNOSIS — M9903 Segmental and somatic dysfunction of lumbar region: Secondary | ICD-10-CM | POA: Diagnosis not present

## 2019-06-01 DIAGNOSIS — M62830 Muscle spasm of back: Secondary | ICD-10-CM | POA: Diagnosis not present

## 2019-06-01 DIAGNOSIS — M9902 Segmental and somatic dysfunction of thoracic region: Secondary | ICD-10-CM | POA: Diagnosis not present

## 2019-06-01 DIAGNOSIS — M9901 Segmental and somatic dysfunction of cervical region: Secondary | ICD-10-CM | POA: Diagnosis not present

## 2019-06-02 DIAGNOSIS — M9901 Segmental and somatic dysfunction of cervical region: Secondary | ICD-10-CM | POA: Diagnosis not present

## 2019-06-02 DIAGNOSIS — M9902 Segmental and somatic dysfunction of thoracic region: Secondary | ICD-10-CM | POA: Diagnosis not present

## 2019-06-02 DIAGNOSIS — M9903 Segmental and somatic dysfunction of lumbar region: Secondary | ICD-10-CM | POA: Diagnosis not present

## 2019-06-02 DIAGNOSIS — M62830 Muscle spasm of back: Secondary | ICD-10-CM | POA: Diagnosis not present

## 2019-06-04 DIAGNOSIS — M9901 Segmental and somatic dysfunction of cervical region: Secondary | ICD-10-CM | POA: Diagnosis not present

## 2019-06-04 DIAGNOSIS — M9902 Segmental and somatic dysfunction of thoracic region: Secondary | ICD-10-CM | POA: Diagnosis not present

## 2019-06-04 DIAGNOSIS — M9903 Segmental and somatic dysfunction of lumbar region: Secondary | ICD-10-CM | POA: Diagnosis not present

## 2019-06-04 DIAGNOSIS — M62830 Muscle spasm of back: Secondary | ICD-10-CM | POA: Diagnosis not present

## 2019-06-08 ENCOUNTER — Encounter: Payer: Self-pay | Admitting: Family

## 2019-06-08 ENCOUNTER — Ambulatory Visit (INDEPENDENT_AMBULATORY_CARE_PROVIDER_SITE_OTHER): Payer: Medicare HMO

## 2019-06-08 ENCOUNTER — Other Ambulatory Visit: Payer: Self-pay

## 2019-06-08 ENCOUNTER — Ambulatory Visit (INDEPENDENT_AMBULATORY_CARE_PROVIDER_SITE_OTHER): Payer: Medicare HMO | Admitting: Family

## 2019-06-08 VITALS — BP 136/80 | HR 81 | Temp 98.2°F | Ht 68.0 in | Wt 164.8 lb

## 2019-06-08 DIAGNOSIS — M25512 Pain in left shoulder: Secondary | ICD-10-CM

## 2019-06-08 DIAGNOSIS — M542 Cervicalgia: Secondary | ICD-10-CM

## 2019-06-08 DIAGNOSIS — M19012 Primary osteoarthritis, left shoulder: Secondary | ICD-10-CM | POA: Diagnosis not present

## 2019-06-08 MED ORDER — BACLOFEN 10 MG PO TABS: 10.0000 mg | ORAL_TABLET | Freq: Three times a day (TID) | ORAL | 0 refills | Status: DC

## 2019-06-08 MED ORDER — DICLOFENAC SODIUM 75 MG PO TBEC: 75.0000 mg | DELAYED_RELEASE_TABLET | Freq: Two times a day (BID) | ORAL | 0 refills | Status: DC

## 2019-06-08 NOTE — Patient Instructions (Signed)
Musculoskeletal Pain Musculoskeletal pain refers to aches and pains in your bones, joints, muscles, and the tissues that surround them. This pain can occur in any part of the body. It can last for a short time (acute) or a long time (chronic). A physical exam, lab tests, and imaging studies may be done to find the cause of your musculoskeletal pain. Follow these instructions at home:  Lifestyle  Try to control or lower your stress levels. Stress increases muscle tension and can worsen musculoskeletal pain. It is important to recognize when you are anxious or stressed and learn ways to manage it. This may include: ? Meditation or yoga. ? Cognitive or behavioral therapy. ? Acupuncture or massage therapy.  You may continue all activities unless the activities cause more pain. When the pain gets better, slowly resume your normal activities. Gradually increase the intensity and duration of your activities or exercise. Managing pain, stiffness, and swelling  Take over-the-counter and prescription medicines only as told by your health care provider.  When your pain is severe, bed rest may be helpful. Lie or sit in any position that is comfortable, but get out of bed and walk around at least every couple of hours.  If directed, apply heat to the affected area as often as told by your health care provider. Use the heat source that your health care provider recommends, such as a moist heat pack or a heating pad. ? Place a towel between your skin and the heat source. ? Leave the heat on for 20-30 minutes. ? Remove the heat if your skin turns bright red. This is especially important if you are unable to feel pain, heat, or cold. You may have a greater risk of getting burned.  If directed, put ice on the painful area. ? Put ice in a plastic bag. ? Place a towel between your skin and the bag. ? Leave the ice on for 20 minutes, 2-3 times a day. General instructions  Your health care provider may  recommend that you see a physical therapist. This person can help you come up with a safe exercise program. Do any exercises as told by your physical therapist.  Keep all follow-up visits, including any physical therapy visits, as told by your health care providers. This is important. Contact a health care provider if:  Your pain gets worse.  Medicines do not help ease your pain.  You cannot use the part of your body that hurts, such as your arm, leg, or neck.  You have trouble sleeping.  You have trouble doing your normal activities. Get help right away if:  You have a new injury and your pain is worse or different.  You feel numb or you have tingling in the painful area. Summary  Musculoskeletal pain refers to aches and pains in your bones, joints, muscles, and the tissues that surround them.  This pain can occur in any part of the body.  Your health care provider may recommend that you see a physical therapist. This person can help you come up with a safe exercise program. Do any exercises as told by your physical therapist.  Lower your stress level. Stress can worsen musculoskeletal pain. Ways to lower stress may include meditation, yoga, cognitive or behavioral therapy, acupuncture, and massage therapy. This information is not intended to replace advice given to you by your health care provider. Make sure you discuss any questions you have with your health care provider. Document Revised: 01/18/2017 Document Reviewed: 03/07/2016 Elsevier Patient   Education  2020 Elsevier Inc.  

## 2019-06-08 NOTE — Progress Notes (Signed)
Subjective:    Patient ID: Theresa Gamble, female    DOB: 09-Jul-1945, 74 y.o.   MRN: [id number]  Chief Complaint  Patient presents with  . Shoulder Pain    left shoulder pain on going for years. Aleve helps it.   Pt presents to the office today with recurrent left neck pain. She reports she is followed by chiropractor monthly.  Neck Pain  This is a recurrent problem. The current episode started 1 to 4 weeks ago. The problem occurs intermittently. The problem has been gradually improving. The pain is present in the left side. The quality of the pain is described as aching. The pain is at a severity of 2/10 (Friday it was a 10). The pain is mild. The symptoms are aggravated by twisting. Pertinent negatives include no leg pain or photophobia. She has tried bed rest and NSAIDs for the symptoms. The treatment provided mild relief.      Review of Systems  Eyes: Negative for photophobia.  Musculoskeletal: Positive for neck pain.  All other systems reviewed and are negative.      Objective:   Physical Exam Vitals reviewed.  Constitutional:      General: She is not in acute distress.    Appearance: She is well-developed.  HENT:     Head: Normocephalic and atraumatic.  Eyes:     Pupils: Pupils are equal, round, and reactive to light.  Neck:     Thyroid: No thyromegaly.  Cardiovascular:     Rate and Rhythm: Normal rate and regular rhythm.     Heart sounds: Normal heart sounds. No murmur.  Pulmonary:     Effort: Pulmonary effort is normal. No respiratory distress.     Breath sounds: Normal breath sounds. No wheezing.  Abdominal:     General: Bowel sounds are normal. There is no distension.     Palpations: Abdomen is soft.     Tenderness: There is no abdominal tenderness.  Musculoskeletal:        General: No tenderness. Normal range of motion.       Arms:     Cervical back: Normal range of motion and neck supple.     Comments: Full ROM of neck and left shoulder, slight pain with  left shoulder flexion and rotation of neck  Skin:    General: Skin is warm and dry.  Neurological:     Mental Status: She is alert and oriented to person, place, and time.     Cranial Nerves: No cranial nerve deficit.     Deep Tendon Reflexes: Reflexes are normal and symmetric.  Psychiatric:        Behavior: Behavior normal.        Thought Content: Thought content normal.        Judgment: Judgment normal.     BP 136/80   Pulse 81   Temp 98.2 F (36.8 C) (Temporal)   Ht 5\' 8"  (1.727 m)   Wt 164 lb 12.8 oz (74.8 kg)   SpO2 98%   BMI 25.06 kg/m   X-ray- Negative, Preliminary reading by Evelina Dun, FNP [facility]     Assessment & Plan:  Theresa Gamble comes in today with chief complaint of Shoulder Pain (left shoulder pain on going for years. Aleve helps it.)   Diagnosis and orders addressed:  1. Left shoulder pain, unspecified chronicity - DG Shoulder Left - diclofenac (VOLTAREN) 75 MG EC tablet; Take 1 tablet (75 mg total) by mouth 2 (two) times daily.  Dispense: 30  tablet; Refill: 0 - baclofen (LIORESAL) 10 MG tablet; Take 1 tablet (10 mg total) by mouth 3 (three) times daily.  Dispense: 30 each; Refill: 0  2. Neck pain - diclofenac (VOLTAREN) 75 MG EC tablet; Take 1 tablet (75 mg total) by mouth 2 (two) times daily.  Dispense: 30 tablet; Refill: 0 - baclofen (LIORESAL) 10 MG tablet; Take 1 tablet (10 mg total) by mouth 3 (three) times daily.  Dispense: 30 each; Refill: 0   Rest No other NSAID's while taking Diclofenac  Sedation precautions discussed about baclofen Pain has improved today, discussed to take medication as needed for flare ups Keep follow up with PCP   Jannifer Rodney, FNP

## 2019-06-25 ENCOUNTER — Ambulatory Visit: Payer: Medicare HMO | Admitting: Family Medicine

## 2019-07-02 ENCOUNTER — Other Ambulatory Visit: Payer: Self-pay

## 2019-07-02 ENCOUNTER — Ambulatory Visit (INDEPENDENT_AMBULATORY_CARE_PROVIDER_SITE_OTHER): Payer: Medicare HMO | Admitting: Family Medicine

## 2019-07-02 ENCOUNTER — Encounter: Payer: Self-pay | Admitting: Family Medicine

## 2019-07-02 VITALS — BP 122/72 | HR 78 | Temp 97.8°F | Ht 68.0 in | Wt 165.1 lb

## 2019-07-02 DIAGNOSIS — E041 Nontoxic single thyroid nodule: Secondary | ICD-10-CM

## 2019-07-02 DIAGNOSIS — R7303 Prediabetes: Secondary | ICD-10-CM | POA: Diagnosis not present

## 2019-07-02 DIAGNOSIS — I10 Essential (primary) hypertension: Secondary | ICD-10-CM | POA: Diagnosis not present

## 2019-07-02 LAB — BAYER DCA HB A1C WAIVED: HB A1C (BAYER DCA - WAIVED): 6.3 % (ref <7.0)

## 2019-07-02 MED ORDER — HYDROCHLOROTHIAZIDE 12.5 MG PO TABS: 12.5000 mg | ORAL_TABLET | Freq: Every day | ORAL | 3 refills | Status: DC

## 2019-07-02 NOTE — Progress Notes (Signed)
BP 122/72   Pulse 78   Temp 97.8 F (36.6 C)   Ht '5\' 8"'$  (1.727 m)   Wt 165 lb 2 oz (74.9 kg)   SpO2 99%   BMI 25.11 kg/m    Subjective:   Patient ID: Theresa Gamble, female    DOB: 20-Apr-1945, 74 y.o.   MRN: [id number]  HPI: Theresa Gamble is a 73 y.o. female presenting on 07/02/2019 for Medical Management of Chronic Issues and Hypertension   HPI Prediabetes Patient comes in today for recheck of his diabetes. Patient has been currently taking no medication. Patient is not currently on an ACE inhibitor/ARB. Patient has not seen an ophthalmologist this year. Patient denies any issues with their feet. The symptom started onset as an adult hypertension ARE RELATED TO DM   Hypertension Patient is currently on hydrochlorothiazide 12.5, and their blood pressure today is 122/72. Patient denies any lightheadedness or dizziness. Patient denies headaches, blurred vision, chest pains, shortness of breath, or weakness. Denies any side effects from medication and is content with current medication.   Patient's thyroid has been off in the past and she has had a thyroid nodule the past as well, we will recheck her TSH.  Relevant past medical, surgical, family and social history reviewed and updated as indicated. Interim medical history since our last visit reviewed. Allergies and medications reviewed and updated.  Review of Systems  Constitutional: Negative for chills and fever.  Eyes: Negative for visual disturbance.  Respiratory: Negative for chest tightness and shortness of breath.   Cardiovascular: Negative for chest pain and leg swelling.  Genitourinary: Negative for difficulty urinating and dysuria.  Musculoskeletal: Negative for back pain and gait problem.  Skin: Negative for rash.  Neurological: Negative for dizziness, light-headedness and headaches.  Psychiatric/Behavioral: Negative for agitation and behavioral problems.  All other systems reviewed and are negative.   Per HPI  unless specifically indicated above   Allergies as of 07/02/2019      Reactions   Latex Rash      Medication List       Accurate as of Jul 02, 2019  1:58 PM. If you have any questions, ask your nurse or doctor.        STOP taking these medications   baclofen 10 MG tablet Commonly known as: LIORESAL Stopped by: Worthy Rancher, MD   diclofenac 75 MG EC tablet Commonly known as: VOLTAREN Stopped by: Fransisca Kaufmann Dorothee Napierkowski, MD     TAKE these medications   aspirin EC 81 MG tablet Take 81 mg by mouth daily.   calcium carbonate 1500 (600 Ca) MG Tabs tablet Commonly known as: OSCAL Take 600 mg of elemental calcium by mouth daily with breakfast.   hydrochlorothiazide 12.5 MG tablet Commonly known as: HYDRODIURIL Take 1 tablet (12.5 mg total) by mouth daily.   multivitamin tablet Take 1 tablet by mouth daily.        Objective:   BP 122/72   Pulse 78   Temp 97.8 F (36.6 C)   Ht '5\' 8"'$  (1.727 m)   Wt 165 lb 2 oz (74.9 kg)   SpO2 99%   BMI 25.11 kg/m   Wt Readings from Last 3 Encounters:  07/02/19 165 lb 2 oz (74.9 kg)  06/08/19 164 lb 12.8 oz (74.8 kg)  12/26/18 162 lb 3.2 oz (73.6 kg)    Physical Exam Vitals and nursing note reviewed.  Constitutional:      General: She is not in acute distress.  Appearance: She is well-developed. She is not diaphoretic.  Eyes:     Conjunctiva/sclera: Conjunctivae normal.  Cardiovascular:     Rate and Rhythm: Normal rate and regular rhythm.     Heart sounds: Normal heart sounds. No murmur.  Pulmonary:     Effort: Pulmonary effort is normal. No respiratory distress.     Breath sounds: Normal breath sounds. No wheezing.  Musculoskeletal:        General: No swelling. Normal range of motion.  Skin:    General: Skin is warm and dry.     Findings: No rash.  Neurological:     Mental Status: She is alert and oriented to person, place, and time.     Coordination: Coordination normal.  Psychiatric:        Behavior:  Behavior normal.       Assessment & Plan:   Problem List Items Addressed This Visit      Cardiovascular and Mediastinum   Hypertension - Primary   Relevant Medications   hydrochlorothiazide (HYDRODIURIL) 12.5 MG tablet   Other Relevant Orders   CMP14+EGFR   Lipid panel     Endocrine   Right thyroid nodule   Relevant Orders   CBC with Differential/Platelet   TSH     Other   Prediabetes   Relevant Orders   Bayer DCA Hb A1c Waived      No change in medication, will check blood work today, blood pressure looks good Follow up plan: Return in about 6 months (around 01/02/2020), or if symptoms worsen or fail to improve, for Prediabetes and hyper tension.  Counseling provided for all of the vaccine components No orders of the defined types were placed in this encounter.   Caryl Pina, MD [facility] 07/02/2019, 1:58 PM

## 2019-07-03 LAB — CBC WITH DIFFERENTIAL/PLATELET
Basophils Absolute: 0 10*3/uL (ref 0.0–0.2)
Basos: 1 %
EOS (ABSOLUTE): 0.2 10*3/uL (ref 0.0–0.4)
Eos: 4 %
Hematocrit: 39.7 % (ref 34.0–46.6)
Hemoglobin: 12.5 g/dL (ref 11.1–15.9)
Immature Grans (Abs): 0 10*3/uL (ref 0.0–0.1)
Immature Granulocytes: 0 %
Lymphocytes Absolute: 1.4 10*3/uL (ref 0.7–3.1)
Lymphs: 37 %
MCH: 29.6 pg (ref 26.6–33.0)
MCHC: 31.5 g/dL (ref 31.5–35.7)
MCV: 94 fL (ref 79–97)
Monocytes Absolute: 0.5 10*3/uL (ref 0.1–0.9)
Monocytes: 14 %
Neutrophils Absolute: 1.7 10*3/uL (ref 1.4–7.0)
Neutrophils: 44 %
Platelets: 231 10*3/uL (ref 150–450)
RBC: 4.23 x10E6/uL (ref 3.77–5.28)
RDW: 13 % (ref 11.7–15.4)
WBC: 3.9 10*3/uL (ref 3.4–10.8)

## 2019-07-03 LAB — CMP14+EGFR
ALT: 14 IU/L (ref 0–32)
AST: 23 IU/L (ref 0–40)
Albumin/Globulin Ratio: 1 — ABNORMAL LOW (ref 1.2–2.2)
Albumin: 3.7 g/dL (ref 3.7–4.7)
Alkaline Phosphatase: 68 IU/L (ref 39–117)
BUN/Creatinine Ratio: 31 — ABNORMAL HIGH (ref 12–28)
BUN: 20 mg/dL (ref 8–27)
Bilirubin Total: <0.2 mg/dL (ref 0.0–1.2)
CO2: 23 mmol/L (ref 20–29)
Calcium: 10.1 mg/dL (ref 8.7–10.3)
Chloride: 108 mmol/L — ABNORMAL HIGH (ref 96–106)
Creatinine, Ser: 0.65 mg/dL (ref 0.57–1.00)
GFR calc Af Amer: 103 mL/min/{1.73_m2} (ref >59)
GFR calc non Af Amer: 89 mL/min/{1.73_m2} (ref >59)
Globulin, Total: 3.8 g/dL (ref 1.5–4.5)
Glucose: 94 mg/dL (ref 65–99)
Potassium: 4 mmol/L (ref 3.5–5.2)
Sodium: 144 mmol/L (ref 134–144)
Total Protein: 7.5 g/dL (ref 6.0–8.5)

## 2019-07-03 LAB — LIPID PANEL
Chol/HDL Ratio: 2.4 ratio (ref 0.0–4.4)
Cholesterol, Total: 177 mg/dL (ref 100–199)
HDL: 73 mg/dL (ref >39)
LDL Chol Calc (NIH): 93 mg/dL (ref 0–99)
Triglycerides: 58 mg/dL (ref 0–149)
VLDL Cholesterol Cal: 11 mg/dL (ref 5–40)

## 2019-07-03 LAB — TSH: TSH: 2.39 u[IU]/mL (ref 0.450–4.500)

## 2019-09-10 DIAGNOSIS — M9901 Segmental and somatic dysfunction of cervical region: Secondary | ICD-10-CM | POA: Diagnosis not present

## 2019-09-10 DIAGNOSIS — M9903 Segmental and somatic dysfunction of lumbar region: Secondary | ICD-10-CM | POA: Diagnosis not present

## 2019-09-10 DIAGNOSIS — M9902 Segmental and somatic dysfunction of thoracic region: Secondary | ICD-10-CM | POA: Diagnosis not present

## 2019-09-10 DIAGNOSIS — M62830 Muscle spasm of back: Secondary | ICD-10-CM | POA: Diagnosis not present

## 2019-11-05 ENCOUNTER — Ambulatory Visit: Payer: Medicare HMO | Attending: Internal Medicine

## 2019-11-05 DIAGNOSIS — Z23 Encounter for immunization: Secondary | ICD-10-CM

## 2019-11-05 NOTE — Progress Notes (Signed)
   Covid-19 Vaccination Clinic  Name:  Theresa Gamble    MRN: [id number] DOB: [birth year]  11/05/2019  Ms. Theresa Gamble was observed post Covid-19 immunization for 15 minutes without incident. She was provided with Vaccine Information Sheet and instruction to access the V-Safe system.   Ms. Theresa Gamble was instructed to call 911 with any severe reactions post vaccine: Marland Kitchen Difficulty breathing  . Swelling of face and throat  . A fast heartbeat  . A bad rash all over body  . Dizziness and weakness      Covid-19 Vaccination Clinic  Name:  Theresa Gamble    MRN: [id number] DOB: 05-07-1945  11/05/2019  Ms. Theresa Gamble was observed post Covid-19 immunization for 15 minutes without incident. She was provided with Vaccine Information Sheet and instruction to access the V-Safe system.   Ms. Theresa Gamble was instructed to call 911 with any severe reactions post vaccine: Marland Kitchen Difficulty breathing  . Swelling of face and throat  . A fast heartbeat  . A bad rash all over body  . Dizziness and weakness

## 2020-01-04 ENCOUNTER — Encounter: Payer: Self-pay | Admitting: Family Medicine

## 2020-01-04 ENCOUNTER — Other Ambulatory Visit: Payer: Self-pay

## 2020-01-04 ENCOUNTER — Ambulatory Visit (INDEPENDENT_AMBULATORY_CARE_PROVIDER_SITE_OTHER): Payer: Medicare HMO

## 2020-01-04 ENCOUNTER — Ambulatory Visit (INDEPENDENT_AMBULATORY_CARE_PROVIDER_SITE_OTHER): Payer: Medicare HMO | Admitting: Family Medicine

## 2020-01-04 VITALS — BP 137/84 | HR 91 | Temp 97.0°F | Ht 68.0 in | Wt 162.2 lb

## 2020-01-04 DIAGNOSIS — Z780 Asymptomatic menopausal state: Secondary | ICD-10-CM

## 2020-01-04 DIAGNOSIS — R7303 Prediabetes: Secondary | ICD-10-CM | POA: Diagnosis not present

## 2020-01-04 DIAGNOSIS — Z1231 Encounter for screening mammogram for malignant neoplasm of breast: Secondary | ICD-10-CM | POA: Diagnosis not present

## 2020-01-04 DIAGNOSIS — M545 Low back pain, unspecified: Secondary | ICD-10-CM

## 2020-01-04 DIAGNOSIS — G8929 Other chronic pain: Secondary | ICD-10-CM

## 2020-01-04 DIAGNOSIS — M542 Cervicalgia: Secondary | ICD-10-CM

## 2020-01-04 DIAGNOSIS — I10 Essential (primary) hypertension: Secondary | ICD-10-CM | POA: Diagnosis not present

## 2020-01-04 DIAGNOSIS — Z1211 Encounter for screening for malignant neoplasm of colon: Secondary | ICD-10-CM | POA: Diagnosis not present

## 2020-01-04 DIAGNOSIS — M47812 Spondylosis without myelopathy or radiculopathy, cervical region: Secondary | ICD-10-CM | POA: Diagnosis not present

## 2020-01-04 DIAGNOSIS — M85851 Other specified disorders of bone density and structure, right thigh: Secondary | ICD-10-CM | POA: Diagnosis not present

## 2020-01-04 LAB — BAYER DCA HB A1C WAIVED: HB A1C (BAYER DCA - WAIVED): 6 % (ref <7.0)

## 2020-01-04 MED ORDER — HYDROCHLOROTHIAZIDE 12.5 MG PO TABS: 12.5000 mg | ORAL_TABLET | Freq: Every day | ORAL | 3 refills | Status: DC

## 2020-01-04 NOTE — Progress Notes (Signed)
BP 137/84   Pulse 91   Temp (!) 97 F (36.1 C)   Ht $R'5\' 8"'mE$  (1.727 m)   Wt 162 lb 4 oz (73.6 kg)   SpO2 99%   BMI 24.67 kg/m    Subjective:   Patient ID: Theresa Gamble, female    DOB: [birth year], 74 y.o.   MRN: [id number]  HPI: Symphanie Cederberg is a 74 y.o. female presenting on 01/04/2020 for Medical Management of Chronic Issues and Hypertension   HPI Prediabetes Patient comes in today for recheck of his diabetes. Patient has been currently taking no medication and diet controlled. Patient is not currently on an ACE inhibitor/ARB. Patient has seen an ophthalmologist this year. Patient denies any issues with their feet. The symptom started onset as an adult hypertension ARE RELATED TO DM   Hypertension Patient is currently on hydrochlorothiazide, and their blood pressure today is 137/84. Patient denies any lightheadedness or dizziness. Patient denies headaches, blurred vision, chest pains, shortness of breath, or weakness. Denies any side effects from medication and is content with current medication.   Patient is coming in complaining of neck pain and lower back pain with sciatica.  She says that these have been bothering her more over the past couple months.  She does fight them off and on and its been worse over the past couple weeks as well.  She says it is both sides on her neck and the muscles coming down above both shoulders.  She says on her lower back is been going on for quite a while but worse over the past few months and hurts on both sides but denies any shooting pain down either of her legs currently and it hurts in both of the muscles on both sides.  Patient needs a referral to gastroenterology for colonoscopy and to get a mammogram.  Relevant past medical, surgical, family and social history reviewed and updated as indicated. Interim medical history since our last visit reviewed. Allergies and medications reviewed and updated.  Review of Systems  Constitutional: Negative  for chills and fever.  HENT: Negative for congestion, ear discharge and ear pain.   Eyes: Negative for redness and visual disturbance.  Respiratory: Negative for chest tightness and shortness of breath.   Cardiovascular: Negative for chest pain and leg swelling.  Genitourinary: Negative for difficulty urinating and dysuria.  Musculoskeletal: Positive for arthralgias, back pain, myalgias and neck pain. Negative for gait problem.  Skin: Negative for rash.  Neurological: Negative for light-headedness and headaches.  Psychiatric/Behavioral: Negative for agitation and behavioral problems.  All other systems reviewed and are negative.   Per HPI unless specifically indicated above   Allergies as of 01/04/2020      Reactions   Latex Rash      Medication List       Accurate as of January 04, 2020 11:59 PM. If you have any questions, ask your nurse or doctor.        aspirin EC 81 MG tablet Take 81 mg by mouth daily.   calcium carbonate 1500 (600 Ca) MG Tabs tablet Commonly known as: OSCAL Take 600 mg of elemental calcium by mouth daily with breakfast.   hydrochlorothiazide 12.5 MG tablet Commonly known as: HYDRODIURIL Take 1 tablet (12.5 mg total) by mouth daily.   multivitamin tablet Take 1 tablet by mouth daily.        Objective:   BP 137/84   Pulse 91   Temp (!) 97 F (36.1 C)   Ht 5'  8" (1.727 m)   Wt 162 lb 4 oz (73.6 kg)   SpO2 99%   BMI 24.67 kg/m   Wt Readings from Last 3 Encounters:  01/04/20 162 lb 4 oz (73.6 kg)  07/02/19 165 lb 2 oz (74.9 kg)  06/08/19 164 lb 12.8 oz (74.8 kg)    Physical Exam Vitals and nursing note reviewed.  Constitutional:      General: She is not in acute distress.    Appearance: She is well-developed. She is not diaphoretic.  Eyes:     Conjunctiva/sclera: Conjunctivae normal.  Cardiovascular:     Rate and Rhythm: Normal rate and regular rhythm.     Heart sounds: Normal heart sounds. No murmur heard.   Pulmonary:      Effort: Pulmonary effort is normal. No respiratory distress.     Breath sounds: Normal breath sounds. No wheezing.  Musculoskeletal:        General: Normal range of motion.     Cervical back: Tenderness (Bilateral paraspinal muscular tenderness) present. No deformity, spasms, bony tenderness or crepitus. No pain with movement. Normal range of motion.     Lumbar back: Tenderness (Bilateral paraspinal muscular tenderness) present. No deformity, spasms or bony tenderness. Normal range of motion. Negative right straight leg raise test and negative left straight leg raise test.  Skin:    General: Skin is warm and dry.     Findings: No rash.  Neurological:     Mental Status: She is alert and oriented to person, place, and time.     Coordination: Coordination normal.  Psychiatric:        Behavior: Behavior normal.       Assessment & Plan:   Problem List Items Addressed This Visit      Cardiovascular and Mediastinum   Hypertension - Primary   Relevant Medications   hydrochlorothiazide (HYDRODIURIL) 12.5 MG tablet   Other Relevant Orders   CBC with Differential/Platelet (Completed)     Other   Prediabetes   Relevant Orders   Bayer DCA Hb A1c Waived (Completed)   CMP14+EGFR (Completed)   Lipid panel (Completed)    Other Visit Diagnoses    Essential hypertension       Relevant Medications   hydrochlorothiazide (HYDRODIURIL) 12.5 MG tablet   Neck pain       Relevant Orders   DG Cervical Spine Complete (Completed)   Chronic bilateral low back pain without sciatica       Relevant Orders   DG Lumbar Spine 2-3 Views (Completed)   Postmenopausal       Relevant Orders   DG [facility]FM DEXA (Completed)   Colon cancer screening       Relevant Orders   Ambulatory referral to Gastroenterology   Encounter for screening mammogram for malignant neoplasm of breast       Relevant Orders   MM 3D SCREEN BREAST BILATERAL      Continue current medication, no changes, will do x-ray and for now  conservative management for her back. Follow up plan: Return in about 6 months (around 07/03/2020), or if symptoms worsen or fail to improve, for Hypertension and prediabetes recheck.  Counseling provided for all of the vaccine components Orders Placed This Encounter  Procedures  . MM 3D SCREEN BREAST BILATERAL  . DG WRFM DEXA  . DG Cervical Spine Complete  . DG Lumbar Spine 2-3 Views  . Bayer DCA Hb A1c Waived  . CBC with Differential/Platelet  . CMP14+EGFR  . Lipid panel  .  Ambulatory referral to Gastroenterology    Caryl Pina, MD [facility] 01/12/2020, 9:54 PM

## 2020-01-05 ENCOUNTER — Other Ambulatory Visit: Payer: Self-pay

## 2020-01-05 DIAGNOSIS — M542 Cervicalgia: Secondary | ICD-10-CM

## 2020-01-05 DIAGNOSIS — M545 Low back pain, unspecified: Secondary | ICD-10-CM

## 2020-01-05 LAB — CBC WITH DIFFERENTIAL/PLATELET
Basophils Absolute: 0 10*3/uL (ref 0.0–0.2)
Basos: 1 %
EOS (ABSOLUTE): 0.1 10*3/uL (ref 0.0–0.4)
Eos: 3 %
Hematocrit: 38.2 % (ref 34.0–46.6)
Hemoglobin: 12.7 g/dL (ref 11.1–15.9)
Immature Grans (Abs): 0 10*3/uL (ref 0.0–0.1)
Immature Granulocytes: 0 %
Lymphocytes Absolute: 1.4 10*3/uL (ref 0.7–3.1)
Lymphs: 43 %
MCH: 30.2 pg (ref 26.6–33.0)
MCHC: 33.2 g/dL (ref 31.5–35.7)
MCV: 91 fL (ref 79–97)
Monocytes Absolute: 0.5 10*3/uL (ref 0.1–0.9)
Monocytes: 16 %
Neutrophils Absolute: 1.3 10*3/uL — ABNORMAL LOW (ref 1.4–7.0)
Neutrophils: 37 %
Platelets: 284 10*3/uL (ref 150–450)
RBC: 4.2 x10E6/uL (ref 3.77–5.28)
RDW: 13 % (ref 11.7–15.4)
WBC: 3.4 10*3/uL (ref 3.4–10.8)

## 2020-01-05 LAB — CMP14+EGFR
ALT: 10 IU/L (ref 0–32)
AST: 15 IU/L (ref 0–40)
Albumin/Globulin Ratio: 1.1 — ABNORMAL LOW (ref 1.2–2.2)
Albumin: 3.9 g/dL (ref 3.7–4.7)
Alkaline Phosphatase: 72 IU/L (ref 44–121)
BUN/Creatinine Ratio: 29 — ABNORMAL HIGH (ref 12–28)
BUN: 17 mg/dL (ref 8–27)
Bilirubin Total: 0.2 mg/dL (ref 0.0–1.2)
CO2: 25 mmol/L (ref 20–29)
Calcium: 9.6 mg/dL (ref 8.7–10.3)
Chloride: 105 mmol/L (ref 96–106)
Creatinine, Ser: 0.58 mg/dL (ref 0.57–1.00)
GFR calc Af Amer: 106 mL/min/{1.73_m2} (ref >59)
GFR calc non Af Amer: 92 mL/min/{1.73_m2} (ref >59)
Globulin, Total: 3.5 g/dL (ref 1.5–4.5)
Glucose: 97 mg/dL (ref 65–99)
Potassium: 4.7 mmol/L (ref 3.5–5.2)
Sodium: 143 mmol/L (ref 134–144)
Total Protein: 7.4 g/dL (ref 6.0–8.5)

## 2020-01-05 LAB — LIPID PANEL
Chol/HDL Ratio: 2.9 ratio (ref 0.0–4.4)
Cholesterol, Total: 168 mg/dL (ref 100–199)
HDL: 57 mg/dL (ref >39)
LDL Chol Calc (NIH): 96 mg/dL (ref 0–99)
Triglycerides: 80 mg/dL (ref 0–149)
VLDL Cholesterol Cal: 15 mg/dL (ref 5–40)

## 2020-01-07 ENCOUNTER — Ambulatory Visit: Payer: Medicare HMO | Attending: Family Medicine | Admitting: Physical Therapy

## 2020-01-07 ENCOUNTER — Encounter: Payer: Self-pay | Admitting: Physical Therapy

## 2020-01-07 ENCOUNTER — Other Ambulatory Visit: Payer: Self-pay

## 2020-01-07 DIAGNOSIS — G8929 Other chronic pain: Secondary | ICD-10-CM | POA: Insufficient documentation

## 2020-01-07 DIAGNOSIS — M545 Low back pain, unspecified: Secondary | ICD-10-CM | POA: Insufficient documentation

## 2020-01-07 DIAGNOSIS — M542 Cervicalgia: Secondary | ICD-10-CM | POA: Diagnosis not present

## 2020-01-07 DIAGNOSIS — M6281 Muscle weakness (generalized): Secondary | ICD-10-CM | POA: Diagnosis not present

## 2020-01-07 NOTE — Therapy (Signed)
[facility] [address] [place], [place], [address] Phone: [phone]   Fax:  [phone]  Physical Therapy Evaluation  Patient Details  Name: Theresa Gamble MRN: [id number] Date of Birth: March 31, 1945 Referring Provider (PT): Arville Care, MD   Encounter Date: 01/07/2020   PT End of Session - 01/07/20 0911    Visit Number 1    Number of Visits 12    Date for PT Re-Evaluation 02/25/20    Authorization Type [employer] Medicare (CQ modifier) Progress note every 10th visit    PT Start Time 0815    PT Stop Time 0900    PT Time Calculation (min) 45 min    Activity Tolerance Patient tolerated treatment well    Behavior During Therapy [facility] for tasks assessed/performed           Past Medical History:  Diagnosis Date  . Hypertension     Past Surgical History:  Procedure Laterality Date  . ABDOMINAL HYSTERECTOMY    . CHOLECYSTECTOMY    . EYE SURGERY Bilateral 1990    There were no vitals filed for this visit.    Subjective Assessment - 01/07/20 1719    Subjective COVID-19 screening performed upon arrival. Patient arrived to physical therapy with a chronic 21 year history of neck and lower back pain. Patient reports seeing a chriopractor but with no relief. Patient reports neck pain can radiate to bilateral shoulders and back pain can radiate down the right leg. Patient reports walking increases pain. Patient able to complete ADLs and home activities independently but with pain and rest breaks. Patient's goals are to decrease pain, improve movement, improve standing tolerance, improve ability to perform ADLs and home activites and walk for exercise.    Pertinent History HTN, Latex allergy, prediabetes    Limitations House hold activities    How long can you sit comfortably? 1 hr    How long can you stand comfortably? 1 hr    How long can you walk comfortably? 10 mins    Diagnostic tests x-ray: see media    Patient Stated Goals walk  for exercise    Currently in Pain? Yes    Pain Score 6     Pain Location Back   back and neck   Pain Orientation Lower    Pain Descriptors / Indicators Aching;Sore;Numbness;Throbbing    Pain Type Chronic pain    Pain Radiating Towards Left hand, right leg    Pain Onset More than a month ago    Pain Frequency Constant    Aggravating Factors  standing    Pain Relieving Factors advil    Effect of Pain on Daily Activities "i am tired"              [facility] PT Assessment - 01/07/20 0001      Assessment   Medical Diagnosis neck pain, chronic bilateral low back pain without sciatica    Referring Provider (PT) Arville Care, MD    Onset Date/Surgical Date --   Chronic, ongoing   Next MD Visit "6 months"    Prior Therapy no      Precautions   Precautions None      Restrictions   Weight Bearing Restrictions No      Balance Screen   Has the patient fallen in the past 6 months No    Has the patient had a decrease in activity level because of a fear of falling?  No    Is the patient reluctant to leave  their home because of a fear of falling?  No      Home Environment   Living Environment Private residence    Living Arrangements Spouse/significant other    Type of Home House    Home Access Stairs to enter    Entrance Stairs-Number of Steps 4-5    Entrance Stairs-Rails Can reach both      Prior Function   Level of Independence Independent      Posture/Postural Control   Posture/Postural Control Postural limitations    Postural Limitations Rounded Shoulders;Forward head      ROM / Strength   AROM / PROM / Strength AROM;Strength      AROM   AROM Assessment Site Cervical    Cervical Flexion 25    Cervical Extension 20    Cervical - Right Side Bend 8    Cervical - Left Side Bend 5    Cervical - Right Rotation 53    Cervical - Left Rotation 49      Strength   Strength Assessment Site Hip;Knee    Right/Left Hip Right;Left    Right Hip Flexion 3+/5    Right Hip  Extension 3-/5    Right Hip ABduction 3-/5    Left Hip Flexion 3+/5    Left Hip Extension 3-/5    Left Hip ABduction 3-/5    Right/Left Knee Right;Left    Right Knee Flexion 4-/5    Right Knee Extension 4/5    Left Knee Flexion 4-/5    Left Knee Extension 4/5      Palpation   Palpation comment minimal reports of tenderness to palpation to cervical region; increased tenderness to QL and upper glutes bilaterally                      Objective measurements completed on examination: See above findings.       [facility] Adult PT Treatment/Exercise - 01/07/20 0001      Modalities   Modalities Moist Heat;Electrical Stimulation      Moist Heat Therapy   Number Minutes Moist Heat 10 Minutes    Moist Heat Location Cervical;Lumbar Spine      Electrical Stimulation   Electrical Stimulation Location cervical and lumbar spine    Electrical Stimulation Action pre-mod 2 channels    Electrical Stimulation Parameters 80-150 hz x10 mins    Electrical Stimulation Goals Pain                  PT Education - 01/07/20 1748    Education Details scapular retractions, chin tucks, draw in, supine marching    Person(s) Educated Patient    Methods Explanation;Demonstration;Handout    Comprehension Verbalized understanding               PT Long Term Goals - 01/07/20 1944      PT LONG TERM GOAL #1   Title Patient will be independent with HEP and its progression.    Time 6    Period Weeks    Status New      PT LONG TERM GOAL #2   Title Patient will demonstrate 60+ degrees of cervical rotation AROM to improve ability to scan environment.    Time 6    Period Weeks    Status New      PT LONG TERM GOAL #3   Title Patient will demonstrate 4/5  bilateral LE MMT to improve stability during functional tasks.    Time 6    Period  Weeks    Status New      PT LONG TERM GOAL #4   Title Patient will report ability to perform ADLs with low back and neck pain less than or equal  to 4/10.    Time 6    Period Weeks    Status New                  Plan - 01/07/20 1749    Clinical Impression Statement Patient is a 74 year old female who presents to physical therapy with a chronic history of low back pain and cervical pain. Patient noted with decreased cervical ROM and decreased LE and UE MMT. Patient denies tenderness to palpation to cervical spine but with tenderness to bilateral QLs and upper glutes upon palpation. Patient and PT discussed plan of care and was educated per [facility] policy, patient is to place chiropractic care on hold to attend PT. Patient reported understanding. Patient would benefit from skilled physical therapy to address deficits and address patient's goals.    Personal Factors and Comorbidities Comorbidity 2;Time since onset of injury/illness/exacerbation    Comorbidities HTN, Latex allergy, prediabetes    Examination-Activity Limitations Locomotion Level;Transfers;Stairs;Stand    Examination-Participation Restrictions Cleaning;Meal Prep;Shop    Stability/Clinical Decision Making Stable/Uncomplicated    Clinical Decision Making Low    Rehab Potential Fair    PT Frequency 2x / week    PT Duration 6 weeks    PT Treatment/Interventions ADLs/Self Care Home Management;Cryotherapy;Electrical Stimulation;Moist Heat;Ultrasound;Gait training;Stair training;Functional mobility training;Therapeutic activities;Therapeutic exercise;Balance training;Neuromuscular re-education;Manual techniques;Passive range of motion;Patient/family education    PT Next Visit Plan nustep, UBE, LE and UE strengthening, postural exercises, modalities PRN for pain relief.    PT Home Exercise Plan see patient education section    Consulted and Agree with Plan of Care Patient           Patient will benefit from skilled therapeutic intervention in order to improve the following deficits and impairments:  Decreased activity tolerance, Decreased strength, Postural  dysfunction, Pain, Decreased range of motion, Difficulty walking  Visit Diagnosis: Cervicalgia - Plan: PT plan of care cert/re-cert  Chronic low back pain, unspecified back pain laterality, unspecified whether sciatica present - Plan: PT plan of care cert/re-cert  Muscle weakness (generalized) - Plan: PT plan of care cert/re-cert     Problem List Patient Active Problem List   Diagnosis Date Noted  . Prediabetes 07/02/2019  . Hypertension 11/07/2018  . Right thyroid nodule 08/06/2017  . Chronic allergic rhinitis 06/15/2016  . Bone spur of right foot 12/08/2015    Guss Bunde, PT, DPT 01/07/2020, 7:55 PM  [facility] [address] [place], [place], [address] Phone: [phone]   Fax:  [phone]  Name: Theresa Gamble MRN: [id number] Date of Birth: 16-Nov-1945

## 2020-01-07 NOTE — Patient Instructions (Signed)
Access Code: [email or web address] URL: [email or web address] Date: 01/07/2020 Prepared by: Guss Bunde  Exercises Seated Cervical Retraction - 1 x daily - 7 x weekly - 2 sets - 10 reps - 3" hold Seated Scapular Retraction - 1 x daily - 7 x weekly - 2 sets - 10 reps - 3" hold Supine Transversus Abdominis Bracing - Hands on Stomach - 1 x daily - 7 x weekly - 2 sets - 10 reps - 3" hold Hooklying Small March - 1 x daily - 7 x weekly - 2 sets - 10 reps - 3" hold

## 2020-01-12 ENCOUNTER — Ambulatory Visit: Payer: Medicare HMO | Admitting: Physical Therapy

## 2020-01-12 ENCOUNTER — Other Ambulatory Visit: Payer: Self-pay

## 2020-01-12 ENCOUNTER — Encounter: Payer: Self-pay | Admitting: Physical Therapy

## 2020-01-12 DIAGNOSIS — M6281 Muscle weakness (generalized): Secondary | ICD-10-CM

## 2020-01-12 DIAGNOSIS — M542 Cervicalgia: Secondary | ICD-10-CM

## 2020-01-12 DIAGNOSIS — G8929 Other chronic pain: Secondary | ICD-10-CM | POA: Diagnosis not present

## 2020-01-12 DIAGNOSIS — M545 Low back pain, unspecified: Secondary | ICD-10-CM | POA: Diagnosis not present

## 2020-01-12 NOTE — Therapy (Signed)
[facility] [address] [place], [place], [address] Phone: [phone]   Fax:  [phone]  Physical Therapy Treatment  Patient Details  Name: Theresa Gamble MRN: [id number] Date of Birth: 06-22-1945 Referring Provider (PT): Arville Care, MD   Encounter Date: 01/12/2020   PT End of Session - 01/12/20 0820    Visit Number 2    Number of Visits 12    Date for PT Re-Evaluation 02/25/20    Authorization Type [employer] Medicare (CQ modifier) Progress note every 10th visit    PT Start Time 0817    PT Stop Time 0901    PT Time Calculation (min) 44 min    Activity Tolerance Patient tolerated treatment well    Behavior During Therapy [facility] for tasks assessed/performed           Past Medical History:  Diagnosis Date  . Hypertension     Past Surgical History:  Procedure Laterality Date  . ABDOMINAL HYSTERECTOMY    . CHOLECYSTECTOMY    . EYE SURGERY Bilateral 1990    There were no vitals filed for this visit.   Subjective Assessment - 01/12/20 0819    Subjective COVID 19 screening performed on patient upon arrival. Patient reports the same LBP and cervical pain as last time.    Pertinent History HTN, Latex allergy, prediabetes    Limitations House hold activities    How long can you sit comfortably? 1 hr    How long can you stand comfortably? 1 hr    How long can you walk comfortably? 10 mins    Diagnostic tests x-ray: see media    Patient Stated Goals walk for exercise    Currently in Pain? Yes    Pain Score 5     Pain Location Back    Pain Orientation Lower    Pain Descriptors / Indicators Discomfort    Pain Type Chronic pain    Pain Onset More than a month ago    Pain Frequency Constant              [facility] PT Assessment - 01/12/20 0001      Assessment   Medical Diagnosis neck pain, chronic bilateral low back pain without sciatica    Referring Provider (PT) Arville Care, MD    Next MD Visit "6 months"     Prior Therapy no      Precautions   Precautions None      Restrictions   Weight Bearing Restrictions No                         [facility] Adult PT Treatment/Exercise - 01/12/20 0001      Exercises   Exercises Lumbar;Neck      Neck Exercises: Machines for Strengthening   UBE (Upper Arm Bike) 90 RPM x6 min (forward/backward)      Neck Exercises: Theraband   Horizontal ABduction 20 reps;Limitations    Horizontal ABduction Limitations yellow theraband      Neck Exercises: Supine   Neck Retraction 15 reps;5 secs    Shoulder Flexion Both;15 reps    Shoulder Flexion Limitations with chin tuck      Lumbar Exercises: Aerobic   Nustep L3 x14 min      Lumbar Exercises: Seated   Other Seated Lumbar Exercises Ball press x20 reps 5 sec holds      Lumbar Exercises: Supine   Ab Set 15 reps;5 seconds    Bridge 15 reps;3 seconds  PT Long Term Goals - 01/07/20 1944      PT LONG TERM GOAL #1   Title Patient will be independent with HEP and its progression.    Time 6    Period Weeks    Status New      PT LONG TERM GOAL #2   Title Patient will demonstrate 60+ degrees of cervical rotation AROM to improve ability to scan environment.    Time 6    Period Weeks    Status New      PT LONG TERM GOAL #3   Title Patient will demonstrate 4/5  bilateral LE MMT to improve stability during functional tasks.    Time 6    Period Weeks    Status New      PT LONG TERM GOAL #4   Title Patient will report ability to perform ADLs with low back and neck pain less than or equal to 4/10.    Time 6    Period Weeks    Status New                 Plan - 01/12/20 8756    Clinical Impression Statement Patient presented in clinic with reports of continued LBP and cervical pain. Patient guided through light postural strengthening and introduced to core activation. Patient able to tolerate all therex and required moderate multimodal cueing to correct  technique. Patient encouraged to continue HEP as provided at evaluation to assess response to today's treatment.    Personal Factors and Comorbidities Comorbidity 2;Time since onset of injury/illness/exacerbation    Comorbidities HTN, Latex allergy, prediabetes    Examination-Activity Limitations Locomotion Level;Transfers;Stairs;Stand    Examination-Participation Restrictions Cleaning;Meal Prep;Shop    Stability/Clinical Decision Making Stable/Uncomplicated    Rehab Potential Fair    PT Frequency 2x / week    PT Duration 6 weeks    PT Treatment/Interventions ADLs/Self Care Home Management;Cryotherapy;Electrical Stimulation;Moist Heat;Ultrasound;Gait training;Stair training;Functional mobility training;Therapeutic activities;Therapeutic exercise;Balance training;Neuromuscular re-education;Manual techniques;Passive range of motion;Patient/family education    PT Next Visit Plan nustep, UBE, LE and UE strengthening, postural exercises, modalities PRN for pain relief.    PT Home Exercise Plan see patient education section    Consulted and Agree with Plan of Care Patient           Patient will benefit from skilled therapeutic intervention in order to improve the following deficits and impairments:  Decreased activity tolerance, Decreased strength, Postural dysfunction, Pain, Decreased range of motion, Difficulty walking  Visit Diagnosis: Cervicalgia  Chronic low back pain, unspecified back pain laterality, unspecified whether sciatica present  Muscle weakness (generalized)     Problem List Patient Active Problem List   Diagnosis Date Noted  . Prediabetes 07/02/2019  . Hypertension 11/07/2018  . Right thyroid nodule 08/06/2017  . Chronic allergic rhinitis 06/15/2016  . Bone spur of right foot 12/08/2015    Theresa Gamble, Theresa Gamble 01/12/2020, 9:11 AM  [facility] [address] [place], [place], [address] Phone: [phone]   Fax:   [phone]  Name: Theresa Gamble MRN: [id number] Date of Birth: Sep 18, 1945

## 2020-01-18 ENCOUNTER — Encounter: Payer: Self-pay | Admitting: Physical Therapy

## 2020-01-18 ENCOUNTER — Other Ambulatory Visit: Payer: Self-pay

## 2020-01-18 ENCOUNTER — Ambulatory Visit
Admission: RE | Admit: 2020-01-18 | Discharge: 2020-01-18 | Disposition: A | Discharge status: Home / Self Care | Payer: Medicare HMO | Source: Ambulatory Visit | Attending: Family Medicine | Admitting: Family Medicine

## 2020-01-18 ENCOUNTER — Ambulatory Visit: Payer: Medicare HMO | Admitting: Physical Therapy

## 2020-01-18 DIAGNOSIS — M6281 Muscle weakness (generalized): Secondary | ICD-10-CM | POA: Diagnosis not present

## 2020-01-18 DIAGNOSIS — G8929 Other chronic pain: Secondary | ICD-10-CM

## 2020-01-18 DIAGNOSIS — Z1231 Encounter for screening mammogram for malignant neoplasm of breast: Secondary | ICD-10-CM | POA: Diagnosis not present

## 2020-01-18 DIAGNOSIS — M545 Low back pain, unspecified: Secondary | ICD-10-CM | POA: Diagnosis not present

## 2020-01-18 DIAGNOSIS — M542 Cervicalgia: Secondary | ICD-10-CM | POA: Diagnosis not present

## 2020-01-18 NOTE — Therapy (Signed)
[facility] [address] [place], [place], [address] Phone: [phone]   Fax:  [phone]  Physical Therapy Treatment  Patient Details  Name: Theresa Gamble MRN: [id number] Date of Birth: Jun 22, 1945 Referring Provider (PT): Arville Care, MD   Encounter Date: 01/18/2020   PT End of Session - 01/18/20 0904    Visit Number 3    Number of Visits 12    Date for PT Re-Evaluation 02/25/20    Authorization Type [employer] (CQ modifier) Progress note every 10th visit    Authorization Time Period 01/07/2020-03/05/2019; 12 visits    Authorization - Visit Number 3    Authorization - Number of Visits 12    PT Start Time 0900    PT Stop Time 0944    PT Time Calculation (min) 44 min    Activity Tolerance Patient tolerated treatment well    Behavior During Therapy [facility] for tasks assessed/performed           Past Medical History:  Diagnosis Date  . Hypertension     Past Surgical History:  Procedure Laterality Date  . ABDOMINAL HYSTERECTOMY    . CHOLECYSTECTOMY    . EYE SURGERY Bilateral 1990    There were no vitals filed for this visit.   Subjective Assessment - 01/18/20 0903    Subjective COVID 19 screening performed on patient upon arrival. Patient reports about a 5/10. States the more she moves her neck the better it feels.    Pertinent History HTN, Latex allergy, prediabetes    Limitations House hold activities    How long can you sit comfortably? 1 hr    How long can you stand comfortably? 1 hr    How long can you walk comfortably? 10 mins    Diagnostic tests x-ray: see media    Patient Stated Goals walk for exercise    Currently in Pain? Yes    Pain Score 5     Pain Location Back    Pain Orientation Lower    Pain Descriptors / Indicators Discomfort    Pain Type Chronic pain    Pain Onset More than a month ago    Pain Frequency Constant              [facility] PT Assessment - 01/18/20 0001      Assessment    Medical Diagnosis neck pain, chronic bilateral low back pain without sciatica    Referring Provider (PT) Arville Care, MD    Next MD Visit "6 months"    Prior Therapy no      Precautions   Precautions None                         [facility] Adult PT Treatment/Exercise - 01/18/20 0001      Exercises   Exercises Lumbar;Neck      Neck Exercises: Machines for Strengthening   UBE (Upper Arm Bike) 120 RPM x8 min (forward/backward)      Lumbar Exercises: Aerobic   Nustep L3 x15 min      Lumbar Exercises: Standing   Row Strengthening;Both;15 reps    Theraband Level (Row) Level 1 (Yellow)    Row Limitations --    Shoulder Extension Strengthening;Both;15 reps    Theraband Level (Shoulder Extension) Level 1 (Yellow)    Shoulder Extension Limitations --      Lumbar Exercises: Supine   Ab Set 15 reps;5 seconds    Clam 15 reps;3 seconds  Bridge 15 reps;3 seconds      Modalities   Modalities --      Neck Exercises: Stretches   Upper Trapezius Stretch 3 reps;30 seconds                  PT Education - 01/18/20 0952    Education Details Body mechanics for ADLs    Person(s) Educated Patient    Methods Explanation;Handout    Comprehension Verbalized understanding               PT Long Term Goals - 01/07/20 1944      PT LONG TERM GOAL #1   Title Patient will be independent with HEP and its progression.    Time 6    Period Weeks    Status New      PT LONG TERM GOAL #2   Title Patient will demonstrate 60+ degrees of cervical rotation AROM to improve ability to scan environment.    Time 6    Period Weeks    Status New      PT LONG TERM GOAL #3   Title Patient will demonstrate 4/5  bilateral LE MMT to improve stability during functional tasks.    Time 6    Period Weeks    Status New      PT LONG TERM GOAL #4   Title Patient will report ability to perform ADLs with low back and neck pain less than or equal to 4/10.    Time 6    Period Weeks     Status New                 Plan - 01/18/20 8527    Clinical Impression Statement Patient responded fairly well to the addition of new TEs. Patient provided with verbal cuing and demonstration for proper technique. Patient reported increased pain in lower cervical upon completion of UBE. Decrease of pain noted after cervical stretching. Body mechanics for ADLs reviewed to which patient reported understanding.    Personal Factors and Comorbidities Comorbidity 2;Time since onset of injury/illness/exacerbation    Comorbidities HTN, Latex allergy, prediabetes    Examination-Activity Limitations Locomotion Level;Transfers;Stairs;Stand    Examination-Participation Restrictions Cleaning;Meal Prep;Shop    Stability/Clinical Decision Making Stable/Uncomplicated    Clinical Decision Making Low    Rehab Potential Fair    PT Frequency 2x / week    PT Duration 6 weeks    PT Treatment/Interventions ADLs/Self Care Home Management;Cryotherapy;Electrical Stimulation;Moist Heat;Ultrasound;Gait training;Stair training;Functional mobility training;Therapeutic activities;Therapeutic exercise;Balance training;Neuromuscular re-education;Manual techniques;Passive range of motion;Patient/family education    PT Next Visit Plan nustep, UBE, LE and UE strengthening, postural exercises, modalities PRN for pain relief.    PT Home Exercise Plan see patient education section    Consulted and Agree with Plan of Care Patient           Patient will benefit from skilled therapeutic intervention in order to improve the following deficits and impairments:  Decreased activity tolerance, Decreased strength, Postural dysfunction, Pain, Decreased range of motion, Difficulty walking  Visit Diagnosis: Chronic low back pain, unspecified back pain laterality, unspecified whether sciatica present  Muscle weakness (generalized)  Cervicalgia     Problem List Patient Active Problem List   Diagnosis Date Noted  .  Prediabetes 07/02/2019  . Hypertension 11/07/2018  . Right thyroid nodule 08/06/2017  . Chronic allergic rhinitis 06/15/2016  . Bone spur of right foot 12/08/2015    Guss Bunde, PT, DPT 01/18/2020, 9:55 AM  [facility] [address] [place], [place], [address] Phone: [phone]   Fax:  [phone]  Name: Theresa Gamble MRN: [id number] Date of Birth: 01-14-1946

## 2020-01-21 ENCOUNTER — Ambulatory Visit: Payer: Medicare HMO | Attending: Family Medicine | Admitting: Physical Therapy

## 2020-01-21 ENCOUNTER — Other Ambulatory Visit: Payer: Self-pay

## 2020-01-21 DIAGNOSIS — M6281 Muscle weakness (generalized): Secondary | ICD-10-CM | POA: Diagnosis not present

## 2020-01-21 DIAGNOSIS — M542 Cervicalgia: Secondary | ICD-10-CM | POA: Insufficient documentation

## 2020-01-21 DIAGNOSIS — M545 Low back pain, unspecified: Secondary | ICD-10-CM | POA: Insufficient documentation

## 2020-01-21 DIAGNOSIS — G8929 Other chronic pain: Secondary | ICD-10-CM | POA: Insufficient documentation

## 2020-01-21 NOTE — Therapy (Signed)
[facility] [address] [place], [place], [address] Phone: [phone]   Fax:  [phone]  Physical Therapy Treatment  Patient Details  Name: Theresa Gamble  MRN: [id number] Date of Birth: [birth year] Referring Provider (PT): Arville Care, MD   Encounter Date: 01/21/2020   PT End of Session - 01/21/20 0903    Visit Number 4    Number of Visits 12    Date for PT Re-Evaluation 02/25/20    Authorization Type [employer] (CQ modifier) Progress note every 10th visit    Authorization Time Period 01/07/2020-03/05/2019; 12 visits    Authorization - Visit Number 4    Authorization - Number of Visits 12    PT Start Time 0900    PT Stop Time 0944    PT Time Calculation (min) 44 min    Activity Tolerance Patient tolerated treatment well    Behavior During Therapy [facility] for tasks assessed/performed           Past Medical History:  Diagnosis Date  . Hypertension     Past Surgical History:  Procedure Laterality Date  . ABDOMINAL HYSTERECTOMY    . BREAST BIOPSY    . CHOLECYSTECTOMY    . EYE SURGERY Bilateral 1990    There were no vitals filed for this visit.   Subjective Assessment - 01/21/20 0901    Subjective COVID 19 screening performed on patient upon arrival. Patient reports she went shopping yesterday and felt like her back was going to break in two after walking for about 20 mins    Pertinent History HTN, Latex allergy, prediabetes    Limitations House hold activities    How long can you sit comfortably? 1 hr    How long can you stand comfortably? 1 hr    How long can you walk comfortably? 10 mins    Diagnostic tests x-ray: see media    Patient Stated Goals walk for exercise    Currently in Pain? Yes   did not provide number on pain scale             [facility] PT Assessment - 01/21/20 0001      Assessment   Medical Diagnosis neck pain, chronic bilateral low back pain without sciatica    Referring Provider (PT)  Arville Care, MD    Next MD Visit "6 months"    Prior Therapy no      Precautions   Precautions None                         [facility] Adult PT Treatment/Exercise - 01/21/20 0001      Exercises   Exercises Lumbar;Neck      Neck Exercises: Machines for Strengthening   UBE (Upper Arm Bike) 120 RPM x8 min (forward/backward)      Neck Exercises: Theraband   Horizontal ABduction 20 reps;Limitations    Horizontal ABduction Limitations yellow theraband; seated    Other Theraband Exercises D2 flexion bilateral x20 yellow theraband      Lumbar Exercises: Aerobic   Nustep L3 x15 min      Lumbar Exercises: Seated   Other Seated Lumbar Exercises Ball press x20 reps 5 sec holds      Lumbar Exercises: Supine   Clam 20 reps;3 seconds    Clam Limitations yellow theraband    Bent Knee Raise 20 reps;3 seconds    Bent Knee Raise Limitations yellow theraband    Bridge --    Bridge with  Ball Squeeze 20 reps;3 seconds                       PT Long Term Goals - 01/21/20 0935      PT LONG TERM GOAL #1   Title Patient will be independent with HEP and its progression.    Time 6    Period Weeks    Status On-going      PT LONG TERM GOAL #2   Title Patient will demonstrate 60+ degrees of cervical rotation AROM to improve ability to scan environment.    Time 6    Period Weeks    Status On-going   NT     PT LONG TERM GOAL #3   Title Patient will demonstrate 4/5  bilateral LE MMT to improve stability during functional tasks.    Time 6    Period Weeks    Status On-going      PT LONG TERM GOAL #4   Title Patient will report ability to perform ADLs with low back and neck pain less than or equal to 4/10.    Time 6    Period Weeks    Status On-going                 Plan - 01/21/20 0931    Clinical Impression Statement Patient arrives to physical therapy with reports of ongoing pain in low back and neck. Patient and PT discussed the importance of  building strength and stability as she noted her neck and back are weak. Patient guided through seated and supine TEs for cervical and lumbar strengthening and stability. Demonstration and tactile cuing provided with fair carryover for remaining reps.    Personal Factors and Comorbidities Comorbidity 2;Time since onset of injury/illness/exacerbation    Comorbidities HTN, Latex allergy, prediabetes    Examination-Activity Limitations Locomotion Level;Transfers;Stairs;Stand    Examination-Participation Restrictions Cleaning;Meal Prep;Shop    Stability/Clinical Decision Making Stable/Uncomplicated    Clinical Decision Making Low    Rehab Potential Fair    PT Frequency 2x / week    PT Duration 6 weeks    PT Treatment/Interventions ADLs/Self Care Home Management;Cryotherapy;Electrical Stimulation;Moist Heat;Ultrasound;Gait training;Stair training;Functional mobility training;Therapeutic activities;Therapeutic exercise;Balance training;Neuromuscular re-education;Manual techniques;Passive range of motion;Patient/family education    PT Next Visit Plan nustep, UBE, LE and UE strengthening, postural exercises, modalities PRN for pain relief.    PT Home Exercise Plan see patient education section    Consulted and Agree with Plan of Care Patient           Patient will benefit from skilled therapeutic intervention in order to improve the following deficits and impairments:  Decreased activity tolerance, Decreased strength, Postural dysfunction, Pain, Decreased range of motion, Difficulty walking  Visit Diagnosis: Chronic low back pain, unspecified back pain laterality, unspecified whether sciatica present  Muscle weakness (generalized)  Cervicalgia     Problem List Patient Active Problem List   Diagnosis Date Noted  . Prediabetes 07/02/2019  . Hypertension 11/07/2018  . Right thyroid nodule 08/06/2017  . Chronic allergic rhinitis 06/15/2016  . Bone spur of right foot 12/08/2015    Guss Bunde, PT, DPT 01/21/2020, 9:47 AM  [facility] [address] [place], [place], [address] Phone: [phone]   Fax:  [phone]  Name: Theresa Gamble MRN: [id number] Date of Birth: Jul 13, 1945

## 2020-01-25 ENCOUNTER — Encounter: Payer: Self-pay | Admitting: Physical Therapy

## 2020-01-25 ENCOUNTER — Ambulatory Visit: Payer: Medicare HMO | Admitting: Physical Therapy

## 2020-01-25 ENCOUNTER — Other Ambulatory Visit: Payer: Self-pay

## 2020-01-25 DIAGNOSIS — M542 Cervicalgia: Secondary | ICD-10-CM

## 2020-01-25 DIAGNOSIS — M6281 Muscle weakness (generalized): Secondary | ICD-10-CM | POA: Diagnosis not present

## 2020-01-25 DIAGNOSIS — M545 Low back pain, unspecified: Secondary | ICD-10-CM

## 2020-01-25 DIAGNOSIS — G8929 Other chronic pain: Secondary | ICD-10-CM | POA: Diagnosis not present

## 2020-01-25 NOTE — Therapy (Signed)
[facility] [place], [place], [address] Phone: [phone]   Fax:  [phone]  Physical Therapy Treatment  Patient Details  Name: Theresa Gamble MRN: [id number] Date of Birth: 07-May-1945 Referring Provider (PT): Caryl Pina, MD   Encounter Date: 01/25/2020   PT End of Session - 01/25/20 0829    Visit Number 5    Number of Visits 12    Date for PT Re-Evaluation 02/25/20    Authorization Type [employer] (CQ modifier) Progress note every 10th visit    Authorization Time Period 01/07/2020-03/05/2019; 12 visits    Authorization - Visit Number 5    Authorization - Number of Visits 12    Progress Note Due on Visit 10    PT Start Time 0815    PT Stop Time 0856    PT Time Calculation (min) 41 min    Activity Tolerance Patient tolerated treatment well    Behavior During Therapy [facility] for tasks assessed/performed           Past Medical History:  Diagnosis Date  . Hypertension     Past Surgical History:  Procedure Laterality Date  . ABDOMINAL HYSTERECTOMY    . BREAST BIOPSY    . CHOLECYSTECTOMY    . EYE SURGERY Bilateral 1990    There were no vitals filed for this visit.   Subjective Assessment - 01/25/20 0828    Subjective Covid 19 screen performed upon arrival. Pt arriving reporting no change in pain since her last visit.    Pertinent History HTN, Latex allergy, prediabetes    Limitations House hold activities    Diagnostic tests x-ray: see media    Patient Stated Goals walk for exercise    Currently in Pain? Yes    Pain Score 5     Pain Location Back    Pain Orientation Lower    Pain Descriptors / Indicators Discomfort    Pain Type Chronic pain    Pain Onset More than a month ago              [facility] PT Assessment - 01/25/20 0001      Assessment   Medical Diagnosis neck pain, chronic bilateral low back pain without sciatica    Referring Provider (PT) Caryl Pina, MD                          [facility] Adult PT Treatment/Exercise - 01/25/20 0001      Exercises   Exercises Lumbar;Neck      Neck Exercises: Machines for Strengthening   UBE (Upper Arm Bike) 120 RPM x 6 min (forward/backward)      Neck Exercises: Theraband   Horizontal ABduction 20 reps;Limitations    Horizontal ABduction Limitations yellow theraband; seated    Other Theraband Exercises D2 flexion bilateral x20 yellow theraband      Neck Exercises: Standing   Other Standing Exercises wall posture, discussion on scapular retraction and cervical retraction x 5 holding 15 seconds each      Lumbar Exercises: Aerobic   Nustep L3 x10 min      Lumbar Exercises: Standing   Row Strengthening;Both;20 reps      Lumbar Exercises: Seated   Other Seated Lumbar Exercises clams x 20 yellow theraband, ball squeezes, trunk rotation holding 10 seconds each direction x 4 reps to each side.                   PT Education -  01/25/20 0900    Education Details Issued pt information on home TENS unit.    Person(s) Educated Patient    Methods Explanation;Handout    Comprehension Verbalized understanding               PT Long Term Goals - 01/21/20 0935      PT LONG TERM GOAL #1   Title Patient will be independent with HEP and its progression.    Time 6    Period Weeks    Status On-going      PT LONG TERM GOAL #2   Title Patient will demonstrate 60+ degrees of cervical rotation AROM to improve ability to scan environment.    Time 6    Period Weeks    Status On-going   NT     PT LONG TERM GOAL #3   Title Patient will demonstrate 4/5  bilateral LE MMT to improve stability during functional tasks.    Time 6    Period Weeks    Status On-going      PT LONG TERM GOAL #4   Title Patient will report ability to perform ADLs with low back and neck pain less than or equal to 4/10.    Time 6    Period Weeks    Status On-going                 Plan - 01/25/20 2263    Clinical Impression  Statement Pt tolerating exericses well with instructions for technique and posture correction. Explaination of anatomy and physiology and posture response. Pt encouraged to continue to work on her HEP. Continue skilled PT no goals met this session.    Personal Factors and Comorbidities Comorbidity 2;Time since onset of injury/illness/exacerbation    Comorbidities HTN, Latex allergy, prediabetes    Examination-Activity Limitations Locomotion Level;Transfers;Stairs;Stand    Examination-Participation Restrictions Cleaning;Meal Prep;Shop    Stability/Clinical Decision Making Stable/Uncomplicated    Rehab Potential Fair    PT Frequency 2x / week    PT Duration 6 weeks    PT Treatment/Interventions ADLs/Self Care Home Management;Cryotherapy;Electrical Stimulation;Moist Heat;Ultrasound;Gait training;Stair training;Functional mobility training;Therapeutic activities;Therapeutic exercise;Balance training;Neuromuscular re-education;Manual techniques;Passive range of motion;Patient/family education    PT Next Visit Plan nustep, UBE, LE and UE strengthening, postural exercises, modalities PRN for pain relief.    PT Home Exercise Plan see patient education section    Consulted and Agree with Plan of Care Patient           Patient will benefit from skilled therapeutic intervention in order to improve the following deficits and impairments:  Decreased activity tolerance, Decreased strength, Postural dysfunction, Pain, Decreased range of motion, Difficulty walking  Visit Diagnosis: Chronic low back pain, unspecified back pain laterality, unspecified whether sciatica present  Muscle weakness (generalized)  Cervicalgia     Problem List Patient Active Problem List   Diagnosis Date Noted  . Prediabetes 07/02/2019  . Hypertension 11/07/2018  . Right thyroid nodule 08/06/2017  . Chronic allergic rhinitis 06/15/2016  . Bone spur of right foot 12/08/2015    Theresa Gamble, PT, MPT 01/25/2020, 9:02  AM  [facility] [address] [place], [place], [address] Phone: [phone]   Fax:  [phone]  Name: Theresa Gamble MRN: [id number] Date of Birth: [birth year]

## 2020-01-26 ENCOUNTER — Encounter: Payer: Self-pay | Admitting: Internal Medicine

## 2020-01-28 ENCOUNTER — Encounter: Payer: Self-pay | Admitting: Physical Therapy

## 2020-01-28 ENCOUNTER — Other Ambulatory Visit: Payer: Self-pay

## 2020-01-28 ENCOUNTER — Ambulatory Visit: Payer: Medicare HMO | Admitting: Physical Therapy

## 2020-01-28 DIAGNOSIS — M545 Low back pain, unspecified: Secondary | ICD-10-CM

## 2020-01-28 DIAGNOSIS — M6281 Muscle weakness (generalized): Secondary | ICD-10-CM

## 2020-01-28 DIAGNOSIS — G8929 Other chronic pain: Secondary | ICD-10-CM | POA: Diagnosis not present

## 2020-01-28 DIAGNOSIS — M542 Cervicalgia: Secondary | ICD-10-CM

## 2020-01-28 NOTE — Therapy (Signed)
[facility] [address] [place], [place], [address] Phone: [phone]   Fax:  [phone]  Physical Therapy Treatment  Patient Details  Name: Theresa Gamble MRN: [id number] Date of Birth: 10-10-1945 Referring Provider (PT): Arville Care, MD   Encounter Date: 01/28/2020   PT End of Session - 01/28/20 0851    Visit Number 6    Number of Visits 12    Date for PT Re-Evaluation 02/25/20    Authorization Type [employer] (CQ modifier) Progress note every 10th visit    Authorization Time Period 01/07/2020-03/05/2019; 12 visits    Authorization - Visit Number 6    Authorization - Number of Visits 12    PT Start Time 0815    PT Stop Time 0900    PT Time Calculation (min) 45 min    Activity Tolerance Patient tolerated treatment well    Behavior During Therapy [facility] for tasks assessed/performed           Past Medical History:  Diagnosis Date  . Hypertension     Past Surgical History:  Procedure Laterality Date  . ABDOMINAL HYSTERECTOMY    . BREAST BIOPSY    . CHOLECYSTECTOMY    . EYE SURGERY Bilateral 1990    There were no vitals filed for this visit.   Subjective Assessment - 01/28/20 0847    Subjective Covid 19 screen performed upon arrival. Patient reports ongoing feeling like her back is going to break in two after any activity.    Pertinent History HTN, Latex allergy, prediabetes    Limitations House hold activities    How long can you sit comfortably? 1 hr    How long can you stand comfortably? 1 hr    How long can you walk comfortably? 10 mins    Diagnostic tests x-ray: see media    Patient Stated Goals walk for exercise    Currently in Pain? Yes    Pain Score 4     Pain Location Back    Pain Orientation Lower    Pain Descriptors / Indicators Discomfort    Pain Type Chronic pain    Pain Onset More than a month ago    Pain Frequency Constant              [facility] PT Assessment - 01/28/20 0001       Assessment   Medical Diagnosis neck pain, chronic bilateral low back pain without sciatica    Referring Provider (PT) Arville Care, MD                         [facility] Adult PT Treatment/Exercise - 01/28/20 0001      Exercises   Exercises Lumbar;Neck      Neck Exercises: Machines for Strengthening   UBE (Upper Arm Bike) 90 RPM x 8 min (forward/backward)      Neck Exercises: Theraband   Horizontal ABduction 20 reps    Horizontal ABduction Limitations yellow theraband; seated    Other Theraband Exercises D2 flexion bilateral x20 yellow theraband      Lumbar Exercises: Aerobic   Nustep L3 x10 min      Lumbar Exercises: Standing   Other Standing Lumbar Exercises Chop/lift 2x10 green XTS      Lumbar Exercises: Supine   Clam 20 reps;3 seconds    Clam Limitations yellow theraband    Bent Knee Raise 20 reps;3 seconds    Bent Knee Raise Limitations yellow theraband    Bridge  20 reps;3 seconds    Bridge Limitations yellow theraband resistance at hips                  PT Education - 01/28/20 0905    Education Details horizontal abduction, clam shells supine, marching with band, bridges with resistance at hips    Person(s) Educated Patient    Methods Explanation;Handout;Demonstration    Comprehension Verbalized understanding;Returned demonstration               PT Long Term Goals - 01/21/20 0935      PT LONG TERM GOAL #1   Title Patient will be independent with HEP and its progression.    Time 6    Period Weeks    Status On-going      PT LONG TERM GOAL #2   Title Patient will demonstrate 60+ degrees of cervical rotation AROM to improve ability to scan environment.    Time 6    Period Weeks    Status On-going   NT     PT LONG TERM GOAL #3   Title Patient will demonstrate 4/5  bilateral LE MMT to improve stability during functional tasks.    Time 6    Period Weeks    Status On-going      PT LONG TERM GOAL #4   Title Patient will report  ability to perform ADLs with low back and neck pain less than or equal to 4/10.    Time 6    Period Weeks    Status On-going                 Plan - 01/28/20 1610    Clinical Impression Statement Patient responded fairly well to therapy session. Patient required constant cuing for form and pacing of exericses. Patient educated on justification of exercises and importance of performing. Patient relayed pain is age related changes. Patient provided with new HEP and yellow band and instructed to perform daily. Patient reported understanding.    Personal Factors and Comorbidities Comorbidity 2;Time since onset of injury/illness/exacerbation    Comorbidities HTN, Latex allergy, prediabetes    Examination-Activity Limitations Locomotion Level;Transfers;Stairs;Stand    Examination-Participation Restrictions Cleaning;Meal Prep;Shop    Stability/Clinical Decision Making Stable/Uncomplicated    Clinical Decision Making Low    Rehab Potential Fair    PT Frequency 2x / week    PT Duration 6 weeks    PT Treatment/Interventions ADLs/Self Care Home Management;Cryotherapy;Electrical Stimulation;Moist Heat;Ultrasound;Gait training;Stair training;Functional mobility training;Therapeutic activities;Therapeutic exercise;Balance training;Neuromuscular re-education;Manual techniques;Passive range of motion;Patient/family education    PT Next Visit Plan nustep, UBE, LE and UE strengthening, postural exercises, modalities PRN for pain relief.    PT Home Exercise Plan see patient education section    Consulted and Agree with Plan of Care Patient           Patient will benefit from skilled therapeutic intervention in order to improve the following deficits and impairments:  Decreased activity tolerance,Decreased strength,Postural dysfunction,Pain,Decreased range of motion,Difficulty walking  Visit Diagnosis: Chronic low back pain, unspecified back pain laterality, unspecified whether sciatica  present  Muscle weakness (generalized)  Cervicalgia     Problem List Patient Active Problem List   Diagnosis Date Noted  . Prediabetes 07/02/2019  . Hypertension 11/07/2018  . Right thyroid nodule 08/06/2017  . Chronic allergic rhinitis 06/15/2016  . Bone spur of right foot 12/08/2015    Guss Bunde, PT, DPT 01/28/2020, 9:16 AM  [facility] [address] [place], [place], [address]  Phone: [phone]   Fax:  [phone]  Name: Alura Olveda MRN: [id number] Date of Birth: 03-Nov-1945

## 2020-02-01 ENCOUNTER — Encounter: Payer: Self-pay | Admitting: Physical Therapy

## 2020-02-01 ENCOUNTER — Ambulatory Visit: Payer: Medicare HMO | Admitting: Physical Therapy

## 2020-02-01 ENCOUNTER — Other Ambulatory Visit: Payer: Self-pay

## 2020-02-01 DIAGNOSIS — M545 Low back pain, unspecified: Secondary | ICD-10-CM

## 2020-02-01 DIAGNOSIS — M6281 Muscle weakness (generalized): Secondary | ICD-10-CM | POA: Diagnosis not present

## 2020-02-01 DIAGNOSIS — M542 Cervicalgia: Secondary | ICD-10-CM | POA: Diagnosis not present

## 2020-02-01 DIAGNOSIS — G8929 Other chronic pain: Secondary | ICD-10-CM | POA: Diagnosis not present

## 2020-02-01 NOTE — Therapy (Signed)
[facility] [place], [place], [address] Phone: [phone]   Fax:  [phone]  Physical Therapy Treatment  Patient Details  Name: Theresa Gamble MRN: [id number] Date of Birth: 1945-11-01 Referring Provider (PT): Caryl Pina, MD   Encounter Date: 02/01/2020   PT End of Session - 02/01/20 0835    Visit Number 7    Number of Visits 12    Date for PT Re-Evaluation 02/25/20    Authorization Type [employer] (CQ modifier) Progress note every 10th visit    Authorization - Visit Number 7    Authorization - Number of Visits 12    Progress Note Due on Visit 10    PT Start Time 0818    PT Stop Time 0905    PT Time Calculation (min) 47 min    Activity Tolerance Patient tolerated treatment well    Behavior During Therapy [facility] for tasks assessed/performed           Past Medical History:  Diagnosis Date  . Hypertension     Past Surgical History:  Procedure Laterality Date  . ABDOMINAL HYSTERECTOMY    . BREAST BIOPSY    . CHOLECYSTECTOMY    . EYE SURGERY Bilateral 1990    There were no vitals filed for this visit.   Subjective Assessment - 02/01/20 0818    Subjective Covid 19 screen performed upon arrival. Patient reports more quick, shooting pains in her neck but nothing new. Patient brought in TENS unit.    Pertinent History HTN, Latex allergy, prediabetes    Limitations House hold activities    How long can you sit comfortably? 1 hr    How long can you stand comfortably? 1 hr    How long can you walk comfortably? 10 mins    Diagnostic tests x-ray: see media    Patient Stated Goals walk for exercise    Currently in Pain? Yes    Pain Score 4     Pain Location Back    Pain Orientation Mid;Lower    Pain Descriptors / Indicators Discomfort    Pain Type Chronic pain    Pain Onset More than a month ago    Pain Frequency Constant              [facility] PT Assessment - 02/01/20 0001      Assessment    Medical Diagnosis neck pain, chronic bilateral low back pain without sciatica    Referring Provider (PT) Caryl Pina, MD    Next MD Visit "6 months"    Prior Therapy no      Precautions   Precautions None      Restrictions   Weight Bearing Restrictions No                         [facility] Adult PT Treatment/Exercise - 02/01/20 0001      Neck Exercises: Machines for Strengthening   UBE (Upper Arm Bike) 90 RPM x 6 min (forward/backward)      Neck Exercises: Seated   Upper Extremity D2 Flexion;15 reps;Theraband    Theraband Level (UE D2) Level 1 (Yellow)    Other Seated Exercise Seated hor. abd. red theraband x20 reps      Lumbar Exercises: Aerobic   Nustep L4 x15  min      Lumbar Exercises: Standing   Row Strengthening;Both;20 reps;Limitations    Row Limitations Blue XTS    Shoulder Extension Strengthening;Both;20 reps;Limitations  Shoulder Extension Limitations Florencia Reasons                  PT Education - 02/01/20 0936    Education Details TENS unit set up, parameters    Person(s) Educated Patient    Methods Explanation;Demonstration    Comprehension Verbalized understanding               PT Long Term Goals - 02/01/20 0936      PT LONG TERM GOAL #1   Title Patient will be independent with HEP and its progression.    Time 6    Period Weeks    Status Partially Met      PT LONG TERM GOAL #2   Title Patient will demonstrate 60+ degrees of cervical rotation AROM to improve ability to scan environment.    Time 6    Period Weeks    Status On-going   NT     PT LONG TERM GOAL #3   Title Patient will demonstrate 4/5  bilateral LE MMT to improve stability during functional tasks.    Time 6    Period Weeks    Status On-going      PT LONG TERM GOAL #4   Title Patient will report ability to perform ADLs with low back and neck pain less than or equal to 4/10.    Time 6    Period Weeks    Status Not Met                 Plan -  02/01/20 4193    Clinical Impression Statement Patient presented in clinic with continued high level pain especially with prolonged activity such as cleaning bathrooms. Patient guided through resisted therex with focus on scapular retraction for postural strengthening. Patient educated regarding TENS unit set up and parameters. Patient educated on avoiding chest, anterior neck, face/head for electrodes placement as well as caution in increasing intensity. Patient verbalized understanding of all instruction but was educated to contact our office if any questions arose.    Personal Factors and Comorbidities Comorbidity 2;Time since onset of injury/illness/exacerbation    Comorbidities HTN, Latex allergy, prediabetes    Examination-Activity Limitations Locomotion Level;Transfers;Stairs;Stand    Examination-Participation Restrictions Cleaning;Meal Prep;Shop    Stability/Clinical Decision Making Stable/Uncomplicated    Rehab Potential Fair    PT Frequency 2x / week    PT Duration 6 weeks    PT Treatment/Interventions ADLs/Self Care Home Management;Cryotherapy;Electrical Stimulation;Moist Heat;Ultrasound;Gait training;Stair training;Functional mobility training;Therapeutic activities;Therapeutic exercise;Balance training;Neuromuscular re-education;Manual techniques;Passive range of motion;Patient/family education    PT Next Visit Plan nustep, UBE, LE and UE strengthening, postural exercises, modalities PRN for pain relief.    PT Home Exercise Plan see patient education section    Consulted and Agree with Plan of Care Patient           Patient will benefit from skilled therapeutic intervention in order to improve the following deficits and impairments:  Decreased activity tolerance,Decreased strength,Postural dysfunction,Pain,Decreased range of motion,Difficulty walking  Visit Diagnosis: Chronic low back pain, unspecified back pain laterality, unspecified whether sciatica present  Muscle weakness  (generalized)  Cervicalgia     Problem List Patient Active Problem List   Diagnosis Date Noted  . Prediabetes 07/02/2019  . Hypertension 11/07/2018  . Right thyroid nodule 08/06/2017  . Chronic allergic rhinitis 06/15/2016  . Bone spur of right foot 12/08/2015    Standley Brooking, PTA 02/01/2020, 9:42 AM  [facility] [place],  [place], [address] Phone: [phone]   Fax:  [phone]  Name: Amelita Risinger MRN: [id number] Date of Birth: 1945-08-08

## 2020-02-04 ENCOUNTER — Ambulatory Visit: Payer: Medicare HMO | Admitting: Physical Therapy

## 2020-02-04 ENCOUNTER — Other Ambulatory Visit: Payer: Self-pay

## 2020-02-04 ENCOUNTER — Telehealth: Payer: Self-pay | Admitting: Family Medicine

## 2020-02-04 ENCOUNTER — Encounter: Payer: Self-pay | Admitting: Physical Therapy

## 2020-02-04 DIAGNOSIS — M542 Cervicalgia: Secondary | ICD-10-CM | POA: Diagnosis not present

## 2020-02-04 DIAGNOSIS — G8929 Other chronic pain: Secondary | ICD-10-CM

## 2020-02-04 DIAGNOSIS — M545 Low back pain, unspecified: Secondary | ICD-10-CM | POA: Diagnosis not present

## 2020-02-04 DIAGNOSIS — M6281 Muscle weakness (generalized): Secondary | ICD-10-CM

## 2020-02-04 NOTE — Telephone Encounter (Signed)
Please schedule an appointment to come back in and be assessed so we can then do the re evaluation and order the MRI.

## 2020-02-04 NOTE — Therapy (Signed)
[facility] [place], [place], [address] Phone: [phone]   Fax:  [phone]  Physical Therapy Treatment  Patient Details  Name: Theresa Gamble MRN: [id number] Date of Birth: 1945/06/05 Referring Provider (PT): Caryl Pina, MD   Encounter Date: 02/04/2020   PT End of Session - 02/04/20 0904    Visit Number 8    Number of Visits 12    Date for PT Re-Evaluation 02/25/20    Authorization Type [employer] (CQ modifier) Progress note every 10th visit    Authorization Time Period 01/07/2020-03/05/2019; 12 visits    Authorization - Visit Number 8    Authorization - Number of Visits 12    Progress Note Due on Visit 10    PT Start Time 0903    PT Stop Time 0947    PT Time Calculation (min) 44 min    Activity Tolerance Patient tolerated treatment well    Behavior During Therapy [facility] for tasks assessed/performed           Past Medical History:  Diagnosis Date  . Hypertension     Past Surgical History:  Procedure Laterality Date  . ABDOMINAL HYSTERECTOMY    . BREAST BIOPSY    . CHOLECYSTECTOMY    . EYE SURGERY Bilateral 1990    There were no vitals filed for this visit.   Subjective Assessment - 02/04/20 0923    Subjective COVID 19 screening performed on patient upon arrival. Patient reports that she is having the same pain as prior to PT. Patient reports that she is having L hand numbness and tingling of her feet at night. Reports that she thinks the TENS unit helped some while she used it.    Pertinent History HTN, Latex allergy, prediabetes    Limitations House hold activities    How long can you sit comfortably? 1 hr    How long can you stand comfortably? 1 hr    How long can you walk comfortably? 10 mins    Diagnostic tests x-ray: see media    Patient Stated Goals walk for exercise    Currently in Pain? Yes    Pain Score 3     Pain Location Back    Pain Orientation Mid;Lower    Pain Descriptors /  Indicators Discomfort    Pain Type Chronic pain    Pain Onset More than a month ago    Pain Frequency Constant              [facility] PT Assessment - 02/04/20 0001      Assessment   Medical Diagnosis neck pain, chronic bilateral low back pain without sciatica    Referring Provider (PT) Caryl Pina, MD    Next MD Visit "6 months"    Prior Therapy no      Precautions   Precautions None      Restrictions   Weight Bearing Restrictions No      ROM / Strength   AROM / PROM / Strength AROM      AROM   Overall AROM  Deficits    AROM Assessment Site Cervical    Cervical - Right Rotation 50    Cervical - Left Rotation 40      Strength   Overall Strength Within functional limits for tasks performed    Strength Assessment Site Hip;Knee    Right/Left Hip Right;Left    Right Hip Flexion 4/5    Left Hip Flexion 4/5    Right/Left Knee Right;Left  Right Knee Flexion 4/5    Right Knee Extension 4/5    Left Knee Flexion 4/5    Left Knee Extension 4/5                         [facility] Adult PT Treatment/Exercise - 02/04/20 0001      Neck Exercises: Machines for Strengthening   UBE (Upper Arm Bike) 90 RPM x 4 min (forward/backward)      Neck Exercises: Seated   Other Seated Exercise Seated hor. abd. yellow theraband x20 reps      Lumbar Exercises: Aerobic   Nustep L4 x15  min      Lumbar Exercises: Standing   Row Strengthening;Both;20 reps;Limitations    Row Limitations Green XTS    Shoulder Extension Strengthening;Both;20 reps;Limitations    Shoulder Extension Limitations Green XTS      Lumbar Exercises: Supine   Clam 20 reps    Clam Limitations yellow theraband    Bent Knee Raise 20 reps    Bridge 20 reps;3 seconds                       PT Long Term Goals - 02/04/20 0947      PT LONG TERM GOAL #1   Title Patient will be independent with HEP and its progression.    Time 6    Period Weeks    Status Partially Met      PT LONG TERM  GOAL #2   Title Patient will demonstrate 60+ degrees of cervical rotation AROM to improve ability to scan environment.    Time 6    Period Weeks    Status Not Met   NT     PT LONG TERM GOAL #3   Title Patient will demonstrate 4/5  bilateral LE MMT to improve stability during functional tasks.    Time 6    Period Weeks    Status Achieved      PT LONG TERM GOAL #4   Title Patient will report ability to perform ADLs with low back and neck pain less than or equal to 4/10.    Time 6    Period Weeks    Status Not Met                 Plan - 02/04/20 0957    Clinical Impression Statement Patient presented in clinic with reports of continued cervical and LBP. Patient reporting morning pain is less than afternoon pain after ADLs. Patient also now reporting L hand numbness along with tingling in her feet. Patient educated that with symptoms and continued pain without relief, to contact PCP. Patient guided through more lumbar and postural strengthening with scapular retraction focus and core activation. Limited goal progression due to cervical ROM limitaiton as well as pain. Patient does have TENS unit at home and was educated regarding set up and parameters.    Personal Factors and Comorbidities Comorbidity 2;Time since onset of injury/illness/exacerbation    Comorbidities HTN, Latex allergy, prediabetes    Examination-Activity Limitations Locomotion Level;Transfers;Stairs;Stand    Examination-Participation Restrictions Cleaning;Meal Prep;Shop    Stability/Clinical Decision Making Stable/Uncomplicated    Rehab Potential Fair    PT Frequency 2x / week    PT Duration 6 weeks    PT Treatment/Interventions ADLs/Self Care Home Management;Cryotherapy;Electrical Stimulation;Moist Heat;Ultrasound;Gait training;Stair training;Functional mobility training;Therapeutic activities;Therapeutic exercise;Balance training;Neuromuscular re-education;Manual techniques;Passive range of motion;Patient/family  education    PT Next Visit Plan Patient to  contact PCP    PT Home Exercise Plan see patient education section    Consulted and Agree with Plan of Care Patient           Patient will benefit from skilled therapeutic intervention in order to improve the following deficits and impairments:  Decreased activity tolerance,Decreased strength,Postural dysfunction,Pain,Decreased range of motion,Difficulty walking  Visit Diagnosis: Chronic low back pain, unspecified back pain laterality, unspecified whether sciatica present  Muscle weakness (generalized)  Cervicalgia     Problem List Patient Active Problem List   Diagnosis Date Noted  . Prediabetes 07/02/2019  . Hypertension 11/07/2018  . Right thyroid nodule 08/06/2017  . Chronic allergic rhinitis 06/15/2016  . Bone spur of right foot 12/08/2015    Standley Brooking, PTA 02/04/2020, 10:34 AM  [facility] [place], [place], [address] Phone: [phone]   Fax:  [phone]  Name: Theresa Gamble MRN: [id number] Date of Birth: 1945/05/07

## 2020-02-04 NOTE — Telephone Encounter (Signed)
Appointment scheduled.

## 2020-02-04 NOTE — Telephone Encounter (Signed)
Pt came in to let us know that she has completed 8 sessions out of 12 of physical therapy and does not feel like it is helping. She also stated that her toes start to tingle at night and her hands go numb at times. The pt is requesting to have an MRI done to see if there is something else that is going on.   Call pt @ [phone] to let her know.

## 2020-02-08 ENCOUNTER — Other Ambulatory Visit: Payer: Self-pay

## 2020-02-08 ENCOUNTER — Ambulatory Visit (INDEPENDENT_AMBULATORY_CARE_PROVIDER_SITE_OTHER): Payer: Medicare HMO | Admitting: *Deleted

## 2020-02-08 ENCOUNTER — Ambulatory Visit: Payer: Medicare HMO | Admitting: Physical Therapy

## 2020-02-08 DIAGNOSIS — M545 Low back pain, unspecified: Secondary | ICD-10-CM | POA: Diagnosis not present

## 2020-02-08 DIAGNOSIS — M542 Cervicalgia: Secondary | ICD-10-CM

## 2020-02-08 DIAGNOSIS — M6281 Muscle weakness (generalized): Secondary | ICD-10-CM

## 2020-02-08 DIAGNOSIS — Z0000 Encounter for general adult medical examination without abnormal findings: Secondary | ICD-10-CM

## 2020-02-08 DIAGNOSIS — G8929 Other chronic pain: Secondary | ICD-10-CM | POA: Diagnosis not present

## 2020-02-08 NOTE — Therapy (Signed)
[facility] [place], [place], [address] Phone: [phone]   Fax:  [phone]  Physical Therapy Treatment  Patient Details  Name: Theresa Gamble MRN: [id number] Date of Birth: [birth year] Referring Provider (PT): Caryl Pina, MD   Encounter Date: 02/08/2020   PT End of Session - 02/08/20 0951    Visit Number 9    Number of Visits 12    Date for PT Re-Evaluation 02/25/20    Authorization Type [employer] Medicare (CQ modifier) Progress note every 10th visit    Authorization Time Period 01/07/2020-03/05/2019; 12 visits    Authorization - Visit Number 9    Authorization - Number of Visits 12    Progress Note Due on Visit 10    PT Start Time 0945    PT Stop Time 1029    PT Time Calculation (min) 44 min    Activity Tolerance Patient tolerated treatment well    Behavior During Therapy [facility] for tasks assessed/performed           Past Medical History:  Diagnosis Date  . Hypertension     Past Surgical History:  Procedure Laterality Date  . ABDOMINAL HYSTERECTOMY    . BREAST BIOPSY    . CHOLECYSTECTOMY    . EYE SURGERY Bilateral 1990    There were no vitals filed for this visit.   Subjective Assessment - 02/08/20 0950    Subjective COVID 19 screening performed on patient upon arrival. Patient arrived with no new complaints    Pertinent History HTN, Latex allergy, prediabetes    Limitations House hold activities    How long can you sit comfortably? 1 hr    How long can you stand comfortably? 1 hr    How long can you walk comfortably? 10 mins    Diagnostic tests x-ray: see media    Patient Stated Goals walk for exercise    Currently in Pain? Yes    Pain Score 3     Pain Location Back    Pain Orientation Mid;Lower    Pain Descriptors / Indicators Discomfort    Pain Type Chronic pain    Pain Onset More than a month ago    Pain Frequency Constant    Aggravating Factors  prolong standing    Pain Relieving Factors  meds                             [facility] Adult PT Treatment/Exercise - 02/08/20 0001      Neck Exercises: Machines for Strengthening   UBE (Upper Arm Bike) 90 RPM x 4 min (forward/backward)      Neck Exercises: Seated   Cervical Rotation Both;5 reps   5sec holds   Other Seated Exercise Seated hor. abd. yellow theraband x20 reps      Lumbar Exercises: Aerobic   Nustep L4 x15  min      Lumbar Exercises: Standing   Row Strengthening;Both;20 reps;Limitations    Row Limitations Green XTS    Shoulder Extension Strengthening;Both;20 reps;Limitations    Shoulder Extension Limitations Green XTS      Lumbar Exercises: Supine   Clam 20 reps    Clam Limitations yellow theraband    Bent Knee Raise 3 seconds   2x10   Bridge 20 reps;3 seconds                       PT Long Term Goals - 02/08/20 6962  PT LONG TERM GOAL #1   Title Patient will be independent with HEP and its progression.    Baseline Patient independent with HEP per reported 02/08/20    Time 6    Period Weeks    Status Achieved      PT LONG TERM GOAL #2   Title Patient will demonstrate 60+ degrees of cervical rotation AROM to improve ability to scan environment.    Baseline Right 45/ Left 50 degrees 02/08/20    Time 6    Period Weeks    Status On-going      PT LONG TERM GOAL #3   Title Patient will demonstrate 4/5  bilateral LE MMT to improve stability during functional tasks.    Time 6    Period Weeks    Status Achieved      PT LONG TERM GOAL #4   Title Patient will report ability to perform ADLs with low back and neck pain less than or equal to 4/10.    Baseline Pain up to 7-8/10 02/08/20    Time 6    Period Weeks    Status On-going                 Plan - 02/08/20 1025    Clinical Impression Statement Patient tolerated treatment well today and able to complete all exercises with no increased pain. Patient reported doing HEP at least 1 time daily and no difficulty  with them. patient continues to have pain with prolong standing or ADL's up to 7-8/10. Patient was educated on posture awareness techniques and continued HEP to maintain mobility and strength. today discussed a light walking program and pergression as tolerated. Patient is to F/U with MD post rehab. patient has reported 30% improvement overall. Patient met LTG #1 with remaining ongoing.    Personal Factors and Comorbidities Comorbidity 2;Time since onset of injury/illness/exacerbation    Comorbidities HTN, Latex allergy, prediabetes    Examination-Activity Limitations Locomotion Level;Transfers;Stairs;Stand    Examination-Participation Restrictions Cleaning;Meal Prep;Shop    Stability/Clinical Decision Making Stable/Uncomplicated    Rehab Potential Fair    PT Frequency 2x / week    PT Duration 6 weeks    PT Treatment/Interventions ADLs/Self Care Home Management;Cryotherapy;Electrical Stimulation;Moist Heat;Ultrasound;Gait training;Stair training;Functional mobility training;Therapeutic activities;Therapeutic exercise;Balance training;Neuromuscular re-education;Manual techniques;Passive range of motion;Patient/family education    PT Next Visit Plan DC after remaing visits to F/U with MD/ cont with core and cervical exercises    Consulted and Agree with Plan of Care Patient           Patient will benefit from skilled therapeutic intervention in order to improve the following deficits and impairments:  Decreased activity tolerance,Decreased strength,Postural dysfunction,Pain,Decreased range of motion,Difficulty walking  Visit Diagnosis: Chronic low back pain, unspecified back pain laterality, unspecified whether sciatica present  Muscle weakness (generalized)  Cervicalgia     Problem List Patient Active Problem List   Diagnosis Date Noted  . Prediabetes 07/02/2019  . Hypertension 11/07/2018  . Right thyroid nodule 08/06/2017  . Chronic allergic rhinitis 06/15/2016  . Bone spur of  right foot 12/08/2015    Eman Rynders P, PTA 02/08/2020, 10:30 AM  [facility] [place], [place], [address] Phone: [phone]   Fax:  [phone]  Name: Theresa Gamble MRN: [id number] Date of Birth: 05/08/1945

## 2020-02-08 NOTE — Progress Notes (Signed)
MEDICARE ANNUAL WELLNESS VISIT  02/08/2020  Telephone Visit Disclaimer This Medicare AWV was conducted by telephone due to national recommendations for restrictions regarding the COVID-19 Pandemic (e.g. social distancing).  I verified, using two identifiers, that I am speaking with [facility] or their authorized healthcare agent. I discussed the limitations, risks, security, and privacy concerns of performing an evaluation and management service by telephone and the potential availability of an in-person appointment in the future. The patient expressed understanding and agreed to proceed.  Location of Patient: home Location of Provider (nurse): office  Subjective:    Theresa AvenaLinda Gamble is a 74 y.o. female patient of Dettinger, Elige RadonJoshua A, MD who had a Medicare Annual Wellness Visit today via telephone. Bonita QuinLinda is Retired and lives with their spouse. she has 2 children. she reports that she is socially active and does interact with friends/family regularly. she is not physically active and enjoys movies and flower arranging.  Patient Care Team: Dettinger, Elige RadonJoshua A, MD as PCP - General (Family Medicine)  Advanced Directives 02/08/2020 01/07/2020 02/04/2019  Does Patient Have a Medical Advance Directive? No No No  Would patient like information on creating a medical advance directive? No - Patient declined - No - Patient declined    Hospital Utilization Over the Past 12 Months: # of hospitalizations or ER visits: 0 # of surgeries: 0  Review of Systems    Patient reports that her overall health is worse compared to last year.  History obtained from chart review and the patient  Patient Reported Readings (BP, Pulse, CBG, Weight, etc) none  Pain Assessment Pain : No/denies pain     Current Medications & Allergies (verified) Allergies as of 02/08/2020      Reactions   Latex Rash      Medication List       Accurate as of February 08, 2020  9:12 AM. If you have any questions,  ask your nurse or doctor.        aspirin EC 81 MG tablet Take 81 mg by mouth daily.   calcium carbonate 1500 (600 Ca) MG Tabs tablet Commonly known as: OSCAL Take 600 mg of elemental calcium by mouth daily with breakfast.   hydrochlorothiazide 12.5 MG tablet Commonly known as: HYDRODIURIL Take 1 tablet (12.5 mg total) by mouth daily.   multivitamin tablet Take 1 tablet by mouth daily.       History (reviewed): Past Medical History:  Diagnosis Date  . Hypertension    Past Surgical History:  Procedure Laterality Date  . ABDOMINAL HYSTERECTOMY    . BREAST BIOPSY    . CHOLECYSTECTOMY    . EYE SURGERY Bilateral 1990   Family History  Problem Relation Age of Onset  . Heart disease Mother   . Diabetes Mother   . Stroke Father    Social History   Socioeconomic History  . Marital status: Married    Spouse name: Sharon SellerLloyd  . Number of children: 2  . Years of education: 3212  . Highest education level: High school graduate  Occupational History  . Occupation: retired    Associate Professormployer: [employer]    Comment: 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  Tobacco Use  . Smoking status: Never Smoker  . Smokeless tobacco: Never Used  Vaping Use  . Vaping Use: Never used  Substance and Sexual Activity  . Alcohol use: Never  . Drug use: Never  . Sexual activity: Yes    Birth control/protection: Surgical  Other Topics Concern  . Not  on file  Social History Narrative  . Not on file   Social Determinants of Health   Financial Resource Strain: Low Risk   . Difficulty of Paying Living Expenses: Not hard at all  Food Insecurity: Not on file  Transportation Needs: No Transportation Needs  . Lack of Transportation (Medical): No  . Lack of Transportation (Non-Medical): No  Physical Activity: Inactive  . Days of Exercise per Week: 0 days  . Minutes of Exercise per Session: 0 min  Stress: No Stress Concern Present  . Feeling of Stress : Not at all  Social Connections: Moderately Integrated  .  Frequency of Communication with Friends and Family: Three times a week  . Frequency of Social Gatherings with Friends and Family: Three times a week  . Attends Religious Services: 1 to 4 times per year  . Active Member of Clubs or Organizations: No  . Attends Banker Meetings: Never  . Marital Status: Married    Activities of Daily Living In your present state of health, do you have any difficulty performing the following activities: 02/08/2020  Hearing? Y  Vision? N  Difficulty concentrating or making decisions? N  Walking or climbing stairs? N  Dressing or bathing? N  Doing errands, shopping? N  Preparing Food and eating ? N  Using the Toilet? N  In the past six months, have you accidently leaked urine? N  Do you have problems with loss of bowel control? N  Managing your Medications? N  Managing your Finances? N  Housekeeping or managing your Housekeeping? N  Some recent data might be hidden    Patient Education/ Literacy How often do you need to have someone help you when you read instructions, pamphlets, or other written materials from your doctor or pharmacy?: 1 - Never What is the last grade level you completed in school?: 12  Exercise Current Exercise Habits: The patient does not participate in regular exercise at present, Exercise limited by: Other - see comments;None identified  Diet Patient reports consuming 2 meals a day and 1 snack(s) a day Patient reports that her primary diet is: Regular Patient reports that she does have regular access to food.   Depression Screen PHQ 2/9 Scores 02/08/2020 01/04/2020 07/02/2019 02/04/2019 12/26/2018 11/07/2018 09/18/2018  PHQ - 2 Score 0 0 0 0 0 1 0  PHQ- 9 Score - - - - - - 0     Fall Risk Fall Risk  02/08/2020 01/04/2020 07/02/2019 06/08/2019 02/04/2019  Falls in the past year? 0 0 0 0 0  Number falls in past yr: - - - 0 -  Injury with Fall? - - - 1 -  Risk for fall due to : - - - Other (Comment) -  Follow up  - - - Falls prevention discussed -     Objective:  Theresa Gamble seemed alert and oriented and she participated appropriately during our telephone visit.  Blood Pressure Weight BMI  BP Readings from Last 3 Encounters:  01/04/20 137/84  07/02/19 122/72  06/08/19 136/80   Wt Readings from Last 3 Encounters:  01/04/20 162 lb 4 oz (73.6 kg)  07/02/19 165 lb 2 oz (74.9 kg)  06/08/19 164 lb 12.8 oz (74.8 kg)   BMI Readings from Last 1 Encounters:  01/04/20 24.67 kg/m    *Unable to obtain current vital signs, weight, and BMI due to telephone visit type  Hearing/Vision  . Elice did not seem to have difficulty with hearing/understanding during the telephone conversation .  Reports that she has not had a formal eye exam by an eye care professional within the past year . Reports that she has not had a formal hearing evaluation within the past year *Unable to fully assess hearing and vision during telephone visit type  Cognitive Function: 6CIT Screen 02/08/2020 02/04/2019  What Year? 0 points 0 points  What month? 0 points 0 points  What time? 0 points 0 points  Count back from 20 0 points 0 points  Months in reverse 0 points 0 points  Repeat phrase 0 points 0 points  Total Score 0 0   (Normal:0-7, Significant for Dysfunction: >8)  Normal Cognitive Function Screening: Yes   Immunization & Health Maintenance Record Immunization History  Administered Date(s) Administered  . Influenza, High Dose Seasonal PF 12/06/2014, 11/30/2015, 11/19/2017, 10/28/2018  . Influenza-Unspecified 12/01/2013, 11/20/2016, 11/22/2019  . PFIZER SARS-COV-2 Vaccination 03/08/2019, 03/26/2019, 11/05/2019  . Pneumococcal Conjugate-13 02/17/2015, 12/18/2016  . Pneumococcal Polysaccharide-23 12/01/2013, 10/28/2018  . Tdap 12/18/2016  . Zoster 12/05/2013  . Zoster Recombinat (Shingrix) 06/11/2017, 08/13/2017    Health Maintenance  Topic Date Due  . COLONOSCOPY  Never done  . MAMMOGRAM  01/17/2022  .  TETANUS/TDAP  12/19/2026  . INFLUENZA VACCINE  Completed  . DEXA SCAN  Completed  . COVID-19 Vaccine  Completed  . PNA vac Low Risk Adult  Completed  . Hepatitis C Screening  Discontinued       Assessment  This is a routine wellness examination for [facility].  Health Maintenance: Due or Overdue Health Maintenance Due  Topic Date Due  . COLONOSCOPY  Never done    [facility] does not need a referral for Community Assistance: Care Management:   no Social Work:    no Prescription Assistance:  no Nutrition/Diabetes Education:  no   Plan:  Personalized Goals Goals Addressed            This Visit's Progress   . DIET - INCREASE WATER INTAKE   Not on track    Try to drink 6-8 glasses of water daily    . DIET - INCREASE WATER INTAKE       Pt wanted to add this one again    . Patient Stated       Continue PT on lower back and see what next steps are for getting pain free.      Personalized Health Maintenance & Screening Recommendations  Colorectal cancer screening  Lung Cancer Screening Recommended: no (Low Dose CT Chest recommended if Age 59-80 years, 30 pack-year currently smoking OR have quit w/in past 15 years) Hepatitis C Screening recommended: no HIV Screening recommended: no  Advanced Directives: Written information was not prepared per patient's request.  Referrals & Orders No orders of the defined types were placed in this encounter.   Follow-up Plan . Follow-up with Dettinger, Elige Radon, MD as planned on 02/25/20. . Pt has colonoscopy scheduled for 03/24/20, all other health maintenance is up to date. . Pt is very independent with all ADL's. . Pt is dealing with lower back pain and that is her goal to do whatever it takes to heal this pain. . Pt declines information on Advanced directives. . Pt has no healthcare concerns or questions for this nurse at this time. . AVS printed and mailed to patient.    I have personally reviewed and noted the  following in the patient's chart:   . Medical and social history . Use of alcohol, tobacco or illicit drugs  .  Current medications and supplements . Functional ability and status . Nutritional status . Physical activity . Advanced directives . List of other physicians . Hospitalizations, surgeries, and ER visits in previous 12 months . Vitals . Screenings to include cognitive, depression, and falls . Referrals and appointments  In addition, I have reviewed and discussed with [facility] certain preventive protocols, quality metrics, and best practice recommendations. A written personalized care plan for preventive services as well as general preventive health recommendations is available and can be mailed to the patient at her request.      Conard Novak, LPN  [phone]

## 2020-02-10 ENCOUNTER — Ambulatory Visit: Payer: Medicare HMO | Admitting: *Deleted

## 2020-02-10 ENCOUNTER — Other Ambulatory Visit: Payer: Self-pay

## 2020-02-10 DIAGNOSIS — M6281 Muscle weakness (generalized): Secondary | ICD-10-CM | POA: Diagnosis not present

## 2020-02-10 DIAGNOSIS — M542 Cervicalgia: Secondary | ICD-10-CM

## 2020-02-10 DIAGNOSIS — M545 Low back pain, unspecified: Secondary | ICD-10-CM

## 2020-02-10 DIAGNOSIS — G8929 Other chronic pain: Secondary | ICD-10-CM | POA: Diagnosis not present

## 2020-02-10 NOTE — Therapy (Signed)
[facility] [address] [place], [place], [address] Phone: [phone]   Fax:  [phone]  Physical Therapy Treatment Progress Note Reporting Period 01/07/2020 to 02/10/2020  See note below for Objective Data and Assessment of Progress/Goals. Patient goals are ongoing at this time with reports of LBP improvements. Guss Bunde, PT, DPT     Patient Details  Name: Theresa Gamble MRN: [id number] Date of Birth: 10-21-1945 Referring Provider (PT): Arville Care, MD   Encounter Date: 02/10/2020   PT End of Session - 02/10/20 0827    Visit Number 10    Number of Visits 12    Date for PT Re-Evaluation 02/25/20    Authorization Type [employer] Medicare (CQ modifier) Progress note every 10th visit    Authorization - Visit Number 10    Authorization - Number of Visits 12    Progress Note Due on Visit 10    PT Start Time [phone]           Past Medical History:  Diagnosis Date   Hypertension     Past Surgical History:  Procedure Laterality Date   ABDOMINAL HYSTERECTOMY     BREAST BIOPSY     CHOLECYSTECTOMY     EYE SURGERY Bilateral 1990    There were no vitals filed for this visit.   Subjective Assessment - 02/10/20 0824    Subjective COVID 19 screening performed on patient upon arrival. Patient arrived with no new complaints and feels her LBP is better    Pertinent History HTN, Latex allergy, prediabetes    Limitations House hold activities    How long can you sit comfortably? 1 hr    How long can you stand comfortably? 1 hr    How long can you walk comfortably? 10 mins    Diagnostic tests x-ray: see media    Patient Stated Goals walk for exercise    Currently in Pain? Yes    Pain Location Back    Pain Orientation Mid;Lower    Pain Descriptors / Indicators Discomfort    Pain Type Chronic pain    Pain Onset More than a month ago                             [facility] Adult PT Treatment/Exercise -  02/10/20 0001      Neck Exercises: Seated   Neck Retraction 3 secs;20 reps    Cervical Rotation Both;5 reps   2 finger OP hold 3-5 secs   Other Seated Exercise Seated hor. abd. yellow theraband 3x10 reps      Lumbar Exercises: Aerobic   Nustep L4 x15  min      Lumbar Exercises: Standing   Row Strengthening;Both;20 reps;Limitations    Row Limitations green XTS    Shoulder Extension Strengthening;Both;20 reps;Limitations   Pull down from 90-100 degrees   Shoulder Extension Limitations Green XTS      Lumbar Exercises: Seated   Sit to Stand 20 reps   with UE assist                      PT Long Term Goals - 02/08/20 8250      PT LONG TERM GOAL #1   Title Patient will be independent with HEP and its progression.    Baseline Patient independent with HEP per reported 02/08/20    Time 6    Period Weeks    Status Achieved  PT LONG TERM GOAL #2   Title Patient will demonstrate 60+ degrees of cervical rotation AROM to improve ability to scan environment.    Baseline Right 45/ Left 50 degrees 02/08/20    Time 6    Period Weeks    Status On-going      PT LONG TERM GOAL #3   Title Patient will demonstrate 4/5  bilateral LE MMT to improve stability during functional tasks.    Time 6    Period Weeks    Status Achieved      PT LONG TERM GOAL #4   Title Patient will report ability to perform ADLs with low back and neck pain less than or equal to 4/10.    Baseline Pain up to 7-8/10 02/08/20    Time 6    Period Weeks    Status On-going                 Plan - 02/10/20 6295    Clinical Impression Statement Pt arrived today feelin a little better and reporting less pain with ADLs. Rx focused on core and posture awareness and strengthening. Cervical retraction performed today with need of verbal and tactile cuing. Sit to stand performed with focus on mechanics and Pt did well. Sheet given for HEP. Cervical rotation 52 degrees bil. today    Comorbidities HTN, Latex  allergy, prediabetes    Examination-Participation Restrictions Cleaning;Meal Prep;Shop    Stability/Clinical Decision Making Stable/Uncomplicated    Rehab Potential Fair    PT Frequency 2x / week    PT Duration 6 weeks    PT Treatment/Interventions ADLs/Self Care Home Management;Cryotherapy;Electrical Stimulation;Moist Heat;Ultrasound;Gait training;Stair training;Functional mobility training;Therapeutic activities;Therapeutic exercise;Balance training;Neuromuscular re-education;Manual techniques;Passive range of motion;Patient/family education    PT Next Visit Plan DC after remaing visits to F/U with MD/ cont with core and cervical exercises   to MD 02-25-19    PT Home Exercise Plan see patient education section    Consulted and Agree with Plan of Care Patient           Patient will benefit from skilled therapeutic intervention in order to improve the following deficits and impairments:  Decreased activity tolerance,Decreased strength,Postural dysfunction,Pain,Decreased range of motion,Difficulty walking  Visit Diagnosis: Chronic low back pain, unspecified back pain laterality, unspecified whether sciatica present  Muscle weakness (generalized)  Cervicalgia     Problem List Patient Active Problem List   Diagnosis Date Noted   Prediabetes 07/02/2019   Hypertension 11/07/2018   Right thyroid nodule 08/06/2017   Chronic allergic rhinitis 06/15/2016   Bone spur of right foot 12/08/2015    Shondell Poulson,CHRIS, PTA 02/10/2020, 9:19 AM  [facility] [address] [place], [place], [address] Phone: [phone]   Fax:  [phone]  Name: Theresa Gamble MRN: [id number] Date of Birth: [birth year]

## 2020-02-15 ENCOUNTER — Encounter: Payer: Self-pay | Admitting: Physical Therapy

## 2020-02-15 ENCOUNTER — Ambulatory Visit: Payer: Medicare HMO | Admitting: Physical Therapy

## 2020-02-15 ENCOUNTER — Other Ambulatory Visit: Payer: Self-pay

## 2020-02-15 DIAGNOSIS — G8929 Other chronic pain: Secondary | ICD-10-CM | POA: Diagnosis not present

## 2020-02-15 DIAGNOSIS — M6281 Muscle weakness (generalized): Secondary | ICD-10-CM

## 2020-02-15 DIAGNOSIS — M542 Cervicalgia: Secondary | ICD-10-CM | POA: Diagnosis not present

## 2020-02-15 DIAGNOSIS — M545 Low back pain, unspecified: Secondary | ICD-10-CM

## 2020-02-15 NOTE — Therapy (Signed)
[facility] [address] [place], [place], [address] Phone: [phone]   Fax:  [phone]  Physical Therapy Treatment  Patient Details  Name: Theresa Gamble MRN: [id number] Date of Birth: January 03, 1946 Referring Provider (PT): Arville Care, MD   Encounter Date: 02/15/2020   PT End of Session - 02/15/20 0832    Visit Number 11    Number of Visits 12    Date for PT Re-Evaluation 02/25/20    Authorization Type [employer] Medicare (CQ modifier) Progress note every 10th visit    Authorization Time Period 01/07/2020-03/05/2019; 12 visits    Authorization - Visit Number 11    Authorization - Number of Visits 12    Progress Note Due on Visit 10    PT Start Time 0818    PT Stop Time 0858    PT Time Calculation (min) 40 min    Activity Tolerance Patient tolerated treatment well    Behavior During Therapy [facility] for tasks assessed/performed           Past Medical History:  Diagnosis Date  . Hypertension     Past Surgical History:  Procedure Laterality Date  . ABDOMINAL HYSTERECTOMY    . BREAST BIOPSY    . CHOLECYSTECTOMY    . EYE SURGERY Bilateral 1990    There were no vitals filed for this visit.   Subjective Assessment - 02/15/20 0831    Subjective COVID 19 screening performed on patient upon arrival. Patient arrived with reports of some mid back pain but feels better and didn't have much pain over the holiday.    Pertinent History HTN, Latex allergy, prediabetes    Limitations House hold activities    How long can you sit comfortably? 1 hr    How long can you stand comfortably? 1 hr    How long can you walk comfortably? 10 mins    Diagnostic tests x-ray: see media    Patient Stated Goals walk for exercise    Currently in Pain? No/denies              [facility] PT Assessment - 02/15/20 0001      Assessment   Medical Diagnosis neck pain, chronic bilateral low back pain without sciatica    Referring Provider (PT) Arville Care, MD    Next MD Visit 02/25/2020    Prior Therapy no      Precautions   Precautions None      Restrictions   Weight Bearing Restrictions No                         [facility] Adult PT Treatment/Exercise - 02/15/20 0001      Neck Exercises: Machines for Strengthening   UBE (Upper Arm Bike) 90 RPM x8 min (forward/backward)      Neck Exercises: Theraband   Horizontal ABduction 20 reps;Limitations   yellow theraband   Other Theraband Exercises B D2 yellow theraband x15 reps      Neck Exercises: Standing   Wall Push Ups 20 reps      Lumbar Exercises: Aerobic   Nustep L4 x12  min      Lumbar Exercises: Seated   Sit to Stand 20 reps;Limitations    Sit to Stand Limitations yellow theraband for clam      Lumbar Exercises: Supine   Bridge with clamshell 20 reps;3 seconds;Limitations    Bridge with [employer] Limitations yellow theraband      Lumbar Exercises: Sidelying  Clam Both;20 reps;Limitations    Clam Limitations yellow theraband                       PT Long Term Goals - 02/08/20 7353      PT LONG TERM GOAL #1   Title Patient will be independent with HEP and its progression.    Baseline Patient independent with HEP per reported 02/08/20    Time 6    Period Weeks    Status Achieved      PT LONG TERM GOAL #2   Title Patient will demonstrate 60+ degrees of cervical rotation AROM to improve ability to scan environment.    Baseline Right 45/ Left 50 degrees 02/08/20    Time 6    Period Weeks    Status On-going      PT LONG TERM GOAL #3   Title Patient will demonstrate 4/5  bilateral LE MMT to improve stability during functional tasks.    Time 6    Period Weeks    Status Achieved      PT LONG TERM GOAL #4   Title Patient will report ability to perform ADLs with low back and neck pain less than or equal to 4/10.    Baseline Pain up to 7-8/10 02/08/20    Time 6    Period Weeks    Status On-going                 Plan -  02/15/20 1022    Clinical Impression Statement Patient presented in clinic with less LBP and cervical pain reports. Patient using TENS unit and HEP at home. Patient continues to fatigue with postural strengthening exercises. Focus on posture and technique during therex. Patient did not report any increased pain during treatment and required intermittant VCs to correct technique.    Personal Factors and Comorbidities Comorbidity 2;Time since onset of injury/illness/exacerbation    Comorbidities HTN, Latex allergy, prediabetes    Examination-Activity Limitations Locomotion Level;Transfers;Stairs;Stand    Examination-Participation Restrictions Cleaning;Meal Prep;Shop    Stability/Clinical Decision Making Stable/Uncomplicated    Rehab Potential Fair    PT Frequency 2x / week    PT Duration 6 weeks    PT Treatment/Interventions ADLs/Self Care Home Management;Cryotherapy;Electrical Stimulation;Moist Heat;Ultrasound;Gait training;Stair training;Functional mobility training;Therapeutic activities;Therapeutic exercise;Balance training;Neuromuscular re-education;Manual techniques;Passive range of motion;Patient/family education    PT Next Visit Plan D/C summary.    PT Home Exercise Plan see patient education section    Consulted and Agree with Plan of Care Patient           Patient will benefit from skilled therapeutic intervention in order to improve the following deficits and impairments:  Decreased activity tolerance,Decreased strength,Postural dysfunction,Pain,Decreased range of motion,Difficulty walking  Visit Diagnosis: Chronic low back pain, unspecified back pain laterality, unspecified whether sciatica present  Muscle weakness (generalized)  Cervicalgia     Problem List Patient Active Problem List   Diagnosis Date Noted  . Prediabetes 07/02/2019  . Hypertension 11/07/2018  . Right thyroid nodule 08/06/2017  . Chronic allergic rhinitis 06/15/2016  . Bone spur of right foot  12/08/2015    Marvell Fuller, PTA 02/15/2020, 10:25 AM  [facility] [address] [place], [place], [address] Phone: [phone]   Fax:  [phone]  Name: Theresa Gamble MRN: [id number] Date of Birth: March 26, 1945

## 2020-02-18 ENCOUNTER — Ambulatory Visit: Payer: Medicare HMO | Admitting: Physical Therapy

## 2020-02-18 ENCOUNTER — Encounter: Payer: Self-pay | Admitting: Physical Therapy

## 2020-02-18 ENCOUNTER — Other Ambulatory Visit: Payer: Self-pay

## 2020-02-18 DIAGNOSIS — M545 Low back pain, unspecified: Secondary | ICD-10-CM | POA: Diagnosis not present

## 2020-02-18 DIAGNOSIS — M542 Cervicalgia: Secondary | ICD-10-CM | POA: Diagnosis not present

## 2020-02-18 DIAGNOSIS — M6281 Muscle weakness (generalized): Secondary | ICD-10-CM

## 2020-02-18 DIAGNOSIS — G8929 Other chronic pain: Secondary | ICD-10-CM | POA: Diagnosis not present

## 2020-02-18 NOTE — Therapy (Signed)
[facility] [place], [place], [address] Phone: [phone]   Fax:  [phone]  Physical Therapy Treatment PHYSICAL THERAPY DISCHARGE SUMMARY  Visits from Start of Care: 12  Current functional level related to goals / functional outcomes: See below   Remaining deficits: See goals   Education / Equipment: HEP Plan: Patient agrees to discharge.  Patient goals were partially met. Patient is being discharged due to being pleased with the current functional level.  ?????      Patient Details  Name: Theresa Gamble MRN: [id number] Date of Birth: 29-Oct-1945 Referring Provider (PT): Caryl Pina, MD   Encounter Date: 02/18/2020   PT End of Session - 02/18/20 0830    Visit Number 12    Number of Visits 12    Date for PT Re-Evaluation 02/25/20    Authorization Type [employer] Medicare (CQ modifier) Progress note every 10th visit    Authorization - Visit Number 12    Authorization - Number of Visits 12    Progress Note Due on Visit 10    PT Start Time 0817    PT Stop Time 0903    PT Time Calculation (min) 46 min    Activity Tolerance Patient tolerated treatment well    Behavior During Therapy [facility] for tasks assessed/performed           Past Medical History:  Diagnosis Date  . Hypertension     Past Surgical History:  Procedure Laterality Date  . ABDOMINAL HYSTERECTOMY    . BREAST BIOPSY    . CHOLECYSTECTOMY    . EYE SURGERY Bilateral 1990    There were no vitals filed for this visit.   Subjective Assessment - 02/18/20 0820    Subjective COVID 19 screening performed on patient upon arrival. Patient arrived with some neck pain but has been walking more the last few days.    Pertinent History HTN, Latex allergy, prediabetes    Limitations House hold activities    How long can you sit comfortably? 1 hr    How long can you stand comfortably? 1 hr    How long can you walk comfortably? 10 mins    Diagnostic  tests x-ray: see media    Patient Stated Goals walk for exercise    Currently in Pain? Yes    Pain Score --   No pain score provided   Pain Location Neck    Pain Orientation Right;Lower    Pain Descriptors / Indicators Discomfort    Pain Type Chronic pain    Pain Onset More than a month ago    Pain Frequency Constant              [facility] PT Assessment - 02/18/20 0001      Assessment   Medical Diagnosis neck pain, chronic bilateral low back pain without sciatica    Referring Provider (PT) Caryl Pina, MD    Next MD Visit 02/25/2020    Prior Therapy no      Precautions   Precautions None      Restrictions   Weight Bearing Restrictions No      ROM / Strength   AROM / PROM / Strength AROM      AROM   Overall AROM  Deficits    AROM Assessment Site Cervical    Cervical - Right Rotation 55    Cervical - Left Rotation 55  [facility] Adult PT Treatment/Exercise - 02/18/20 0001      Lumbar Exercises: Aerobic   Nustep L4 x15 min      Modalities   Modalities Electrical Stimulation;Moist Heat;Ultrasound      Moist Heat Therapy   Number Minutes Moist Heat 15 Minutes    Moist Heat Location Cervical      Electrical Stimulation   Electrical Stimulation Location R UT    Electrical Stimulation Action Pre-Mod    Electrical Stimulation Parameters 80-150 hz x15 min    Electrical Stimulation Goals Pain;Tone      Ultrasound   Ultrasound Location R UT    Ultrasound Parameters Combo 1.5 w/cm2, 100%, 1 mhz x10 min    Ultrasound Goals Pain                       PT Long Term Goals - 02/18/20 0831      PT LONG TERM GOAL #1   Title Patient will be independent with HEP and its progression.    Baseline Patient independent with HEP per reported 02/08/20    Time 6    Period Weeks    Status Achieved      PT LONG TERM GOAL #2   Title Patient will demonstrate 60+ degrees of cervical rotation AROM to improve ability to scan  environment.    Baseline Right 45/ Left 50 degrees 02/08/20    Time 6    Period Weeks    Status Not Met      PT LONG TERM GOAL #3   Title Patient will demonstrate 4/5  bilateral LE MMT to improve stability during functional tasks.    Time 6    Period Weeks    Status Achieved      PT LONG TERM GOAL #4   Title Patient will report ability to perform ADLs with low back and neck pain less than or equal to 4/10.    Baseline Pain up to 7-8/10 02/08/20    Time 6    Period Weeks    Status Partially Met                 Plan - 02/18/20 0853    Clinical Impression Statement Patient has progressed fairly well in PT. Patient reports intermittant LBP and cervical pain with ADLs as some days neck pain is worse or vice versa. Patient presented with greater R UT pain and tone per palpation. Patient has been provided HEPs to which she was encouraged to continue after DC to maintain any progress made in PT. Patient does have TENS unit at home for pain control. Patient indicated that pain was across UT along C7 region but primarily R > L. Limited goal progression due to cervical ROM limitations, pain limitations as well. Normal modalities response noted following removal of the modalities.    Personal Factors and Comorbidities Comorbidity 2;Time since onset of injury/illness/exacerbation    Comorbidities HTN, Latex allergy, prediabetes    Examination-Activity Limitations Locomotion Level;Transfers;Stairs;Stand    Examination-Participation Restrictions Cleaning;Meal Prep;Shop    Stability/Clinical Decision Making Stable/Uncomplicated    Rehab Potential Fair    PT Frequency 2x / week    PT Duration 6 weeks    PT Treatment/Interventions ADLs/Self Care Home Management;Cryotherapy;Electrical Stimulation;Moist Heat;Ultrasound;Gait training;Stair training;Functional mobility training;Therapeutic activities;Therapeutic exercise;Balance training;Neuromuscular re-education;Manual techniques;Passive range of  motion;Patient/family education    PT Next Visit Plan D/C summary.    PT Home Exercise Plan see patient education section    Consulted and Agree  with Plan of Care Patient           Patient will benefit from skilled therapeutic intervention in order to improve the following deficits and impairments:  Decreased activity tolerance,Decreased strength,Postural dysfunction,Pain,Decreased range of motion,Difficulty walking  Visit Diagnosis: Chronic low back pain, unspecified back pain laterality, unspecified whether sciatica present  Muscle weakness (generalized)  Cervicalgia     Problem List Patient Active Problem List   Diagnosis Date Noted  . Prediabetes 07/02/2019  . Hypertension 11/07/2018  . Right thyroid nodule 08/06/2017  . Chronic allergic rhinitis 06/15/2016  . Bone spur of right foot 12/08/2015    Standley Brooking, PTA 02/18/20 9:08 AM   [facility] [place], [place], [address] Phone: [phone]   Fax:  [phone]  Name: Theresa Gamble MRN: [id number] Date of Birth: 1945/09/14

## 2020-02-25 ENCOUNTER — Encounter: Payer: Self-pay | Admitting: Family Medicine

## 2020-02-25 ENCOUNTER — Ambulatory Visit (INDEPENDENT_AMBULATORY_CARE_PROVIDER_SITE_OTHER): Payer: Medicare HMO | Admitting: Family Medicine

## 2020-02-25 ENCOUNTER — Other Ambulatory Visit: Payer: Self-pay

## 2020-02-25 VITALS — BP 145/85 | HR 82 | Ht 68.0 in | Wt 163.0 lb

## 2020-02-25 DIAGNOSIS — G8929 Other chronic pain: Secondary | ICD-10-CM

## 2020-02-25 DIAGNOSIS — M545 Low back pain, unspecified: Secondary | ICD-10-CM

## 2020-02-25 DIAGNOSIS — M542 Cervicalgia: Secondary | ICD-10-CM | POA: Diagnosis not present

## 2020-02-25 MED ORDER — DICLOFENAC SODIUM 1 % EX GEL: 2.0000 g | Freq: Four times a day (QID) | CUTANEOUS | 3 refills | Status: DC

## 2020-02-25 MED ORDER — MELOXICAM 7.5 MG PO TABS: 7.5000 mg | ORAL_TABLET | Freq: Every day | ORAL | 0 refills | Status: DC

## 2020-02-25 NOTE — Progress Notes (Signed)
Vitals: BP 145/85, HR 82, SPO2 98%, RR 12 Wt 163 lbs, Ht 5'8"  HPI: Pt is a 74y female complain of back and neck pain that she states has been going on for 20 years but has been progressively getting worse over the past several months. She states that pain aches in her lower back and is sometimes worse on ones side or the other at a 6/10. Her neck also aches and she sometimes experiences pain in her jaw when swallowing. Pt had X-ray 6 months ago which revealed osteoarthritis in her spine and neck. Pt has been taking Advil (3 tablets daily) for several weeks which has alleviated her pain but she expresses concerns continuing it because she believes it may not be good for her. Pt states that she is unable to do certain chores such as lawn mowing due to pain, however, has no issues with other ADLs such as moving around the house, cooking, cleaning, or trouble sleeping. 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 She is otherwise happy and states she would like to know if there is something else she can take for the pain.  Pt denies fevers, chills, unintentional weight loss or gain, HA, dizziness, sore throat, dysphagia, chest pain, SOB, weakness, loss of sensation, feelings of dizziness, loss of balance or recent falls, bowel or bladder incontinence, changes is urinary habits, N/VD, numbness or tingling. Upon exam, pt CN III-XII are intact bilaterally as well as having full ROM in neck, shoulders, back, and hips. Straight leg raise and Pearlean Brownie tests were negative and sharp dull and light touch were intact bilaterally.  PMH: Osteoarthritis, hypertension PSH: breast surgery, cholecystectomy, eye surgery, hysterectomy  Medications:   Aspirin Calcium carbonate HCTZ multivtamin  ROS: Constitutional: denies fevers, chills, weight loss or gain, night sweats HEENT: denies difficulty swallowing, changes in hearing or vision, swelling or tenderness in the neck, congestion or sore  throat Cardiac: denies chest pain, tightness, tachycardia or palpitations Lungs: denies SOB or difficulty breathing GI: Denies N/V/D, constipation, incontinence, abdominal discomfort, or changes in appetite GU: denies urinary incontinence MSK: denies weakness. Endorses pain in the lower back and neck Neuro: denies numbness and tingling, HA, or dizziness Pysch: denies changes in mood  PE General: well appearing and in no acute distress HEENT: neck is supple and nontender, TM pearly gray and intact, PERRLA, throat is normal Cardiac: RRR, no m/r/g Pulm: CTA bilat GI: abdomen is soft and nontender MSK: active and passive ROM intact bilaterally in neck, shoulders, lower back, and hips. Faber and straight leg raise negative negative. Normal gait Neuro: CN III-XII intact bilaterally, sharp-dull and light touch sense intact Psych: positive mood and affect  Labs/Imaging MRI c-spine w/o contrast MRI lumbar spine w/o contrast  Assessment Low back pain due to osteoarthritis  Pt has no neurological deficits nor weakness in her extremities. Pt has arthritis in the spine as seen on previous X-rays. Imaging is needed to determine severity.  Plan Pt scheduled for an MRI to evaluate changes or worsening in her condition as well as rule out other possible causes of pain.  Meloxicam 7.5 mg po qd Voltaren 1% gel 2g topical qid Pt instructed to be seen if condition changes or she develops weakness, numbness, or tingling.  Damaris Hippo 02/25/2020  Problem List Items Addressed This Visit   None   Visit Diagnoses    Chronic bilateral low back pain without sciatica    -  Primary   Relevant Medications  meloxicam (MOBIC) 7.5 MG tablet   diclofenac Sodium (VOLTAREN) 1 % GEL   Other Relevant Orders   MR Cervical Spine Wo Contrast   MR Lumbar Spine Wo Contrast   Cervicalgia       Relevant Medications   meloxicam (MOBIC) 7.5 MG tablet   diclofenac Sodium (VOLTAREN) 1 % GEL   Other Relevant Orders    MR Cervical Spine Wo Contrast   MR Lumbar Spine Wo Contrast       Patient seen and examined with Damaris Hippo, PA student, agree with assessment and plan above.  We will order MRI of C-spine and L-spine because that is where the chronic pain is.  Arville Care, MD [facility] 03/01/2020, 10:32 PM

## 2020-03-10 ENCOUNTER — Other Ambulatory Visit: Payer: Self-pay

## 2020-03-10 ENCOUNTER — Ambulatory Visit (AMBULATORY_SURGERY_CENTER): Payer: Medicare HMO

## 2020-03-10 VITALS — Ht 68.0 in | Wt 162.0 lb

## 2020-03-10 DIAGNOSIS — Z1211 Encounter for screening for malignant neoplasm of colon: Secondary | ICD-10-CM

## 2020-03-10 NOTE — Progress Notes (Signed)
Pre visit performed via phone; patient verified name, DOB, and address; No egg or soy allergy known to patient  No issues with past sedation with any surgeries or procedures No intubation problems in the past  No FH of Malignant Hyperthermia No diet pills per patient No home 02 use per patient  No blood thinners per patient  Pt denies issues with constipation  No A fib or A flutter  EMMI video to pt or via MyChart  COVID 19 guidelines implemented in [place] today with Pt and RN  Pt is fully vaccinated  for Covid  Due to the COVID-19 pandemic we are asking patients to follow certain guidelines.  Pt aware of COVID protocols and [facility] guidelines

## 2020-03-11 ENCOUNTER — Ambulatory Visit (HOSPITAL_COMMUNITY)
Admission: RE | Admit: 2020-03-11 | Discharge: 2020-03-11 | Disposition: A | Discharge status: Home / Self Care | Payer: Medicare HMO | Source: Ambulatory Visit | Attending: Family Medicine | Admitting: Family Medicine

## 2020-03-11 DIAGNOSIS — G8929 Other chronic pain: Secondary | ICD-10-CM | POA: Insufficient documentation

## 2020-03-11 DIAGNOSIS — M542 Cervicalgia: Secondary | ICD-10-CM

## 2020-03-11 DIAGNOSIS — M545 Low back pain, unspecified: Secondary | ICD-10-CM | POA: Insufficient documentation

## 2020-03-14 ENCOUNTER — Telehealth: Payer: Self-pay

## 2020-03-14 DIAGNOSIS — M5030 Other cervical disc degeneration, unspecified cervical region: Secondary | ICD-10-CM

## 2020-03-14 NOTE — Telephone Encounter (Signed)
Sent in referral for patient to orthospine.

## 2020-03-14 NOTE — Telephone Encounter (Signed)
REFERRAL REQUEST Telephone Note  Have you been seen at our office for this problem? YES  She had mri for this (Advise that they may need an appointment with their PCP before a referral can be done)  Reason for Referral: back referral / needs shots Referral discussed with patient: yes Best contact number of patient for referral team:[phone]  [phone]    Has patient been seen by a specialist for this issue before: no Patient provider preference for referral: na Patient location preference for referral: na   Patient notified that referrals can take up to a week or longer to process. If they haven't heard anything within a week they should call back and speak with the referral department.

## 2020-03-14 NOTE — Telephone Encounter (Signed)
Please see result note - referral to be placed per note

## 2020-03-14 NOTE — Telephone Encounter (Signed)
Pt aware of provider feedback and voiced understanding. 

## 2020-03-16 ENCOUNTER — Encounter: Payer: Self-pay | Admitting: Internal Medicine

## 2020-03-24 ENCOUNTER — Encounter: Payer: Self-pay | Admitting: Internal Medicine

## 2020-03-24 ENCOUNTER — Ambulatory Visit (AMBULATORY_SURGERY_CENTER): Payer: Medicare HMO | Admitting: Internal Medicine

## 2020-03-24 ENCOUNTER — Other Ambulatory Visit: Payer: Self-pay

## 2020-03-24 VITALS — BP 137/82 | HR 70 | Temp 97.3°F | Resp 24 | Ht 68.0 in | Wt 162.0 lb

## 2020-03-24 DIAGNOSIS — Z1211 Encounter for screening for malignant neoplasm of colon: Secondary | ICD-10-CM

## 2020-03-24 DIAGNOSIS — I10 Essential (primary) hypertension: Secondary | ICD-10-CM | POA: Diagnosis not present

## 2020-03-24 MED ORDER — SODIUM CHLORIDE 0.9 % IV SOLN: 500.0000 mL | Freq: Once | INTRAVENOUS | Status: DC

## 2020-03-24 NOTE — Op Note (Signed)
[facility] Patient Name: Theresa Gamble Procedure Date: 03/24/2020 7:27 AM MRN: [id number] Endoscopist: Beverley Fiedler , MD Age: 75 Referring MD:  Date of Birth: 03/14/1945 Gender: Female Account #: [id number] Procedure:                Colonoscopy Indications:              Screening for colorectal malignant neoplasm, Last                            colonoscopy 10 years ago Medicines:                Monitored Anesthesia Care Procedure:                Pre-Anesthesia Assessment:                           - Prior to the procedure, a History and Physical                            was performed, and patient medications and                            allergies were reviewed. The patient's tolerance of                            previous anesthesia was also reviewed. The risks                            and benefits of the procedure and the sedation                            options and risks were discussed with the patient.                            All questions were answered, and informed consent                            was obtained. Prior Anticoagulants: The patient has                            taken no previous anticoagulant or antiplatelet                            agents. ASA Grade Assessment: II - A patient with                            mild systemic disease. After reviewing the risks                            and benefits, the patient was deemed in                            satisfactory condition to undergo the procedure.  After obtaining informed consent, the colonoscope                            was passed under direct vision. Throughout the                            procedure, the patient's blood pressure, pulse, and                            oxygen saturations were monitored continuously. The                            Olympus [id number]-[id number] (#[id number]) Colonoscope was                            introduced through the anus and advanced  to the                            cecum, identified by appendiceal orifice and                            ileocecal valve. The colonoscopy was performed                            without difficulty. The patient tolerated the                            procedure well. The quality of the bowel                            preparation was good. The ileocecal valve,                            appendiceal orifice, and rectum were photographed. Scope In: 8:05:45 AM Scope Out: 8:25:53 AM Scope Withdrawal Time: 0 hours 12 minutes 21 seconds  Total Procedure Duration: 0 hours 20 minutes 8 seconds  Findings:                 The digital rectal exam was normal.                           Multiple small and large-mouthed diverticula were                            found in the sigmoid colon.                           Internal hemorrhoids were found during                            retroflexion. The hemorrhoids were small.                           The exam was otherwise without abnormality. Complications:            No immediate complications. Estimated  Blood Loss:     Estimated blood loss: none. Impression:               - Diverticulosis in the sigmoid colon.                           - Small internal hemorrhoids.                           - The examination was otherwise normal.                           - No specimens collected. Recommendation:           - Patient has a contact number available for                            emergencies. The signs and symptoms of potential                            delayed complications were discussed with the                            patient. Return to normal activities tomorrow.                            Written discharge instructions were provided to the                            patient.                           - Resume previous diet.                           - Continue present medications.                           - No repeat colonoscopy due to age  at next                            screening interval and the absence of colonic                            polyps. Beverley Fiedler, MD 03/24/2020 8:28:35 AM This report has been signed electronically.

## 2020-03-24 NOTE — Progress Notes (Signed)
Report to PACU, RN, vss, BBS= Clear.  

## 2020-03-24 NOTE — Patient Instructions (Signed)
Resume previous diet and medications.  YOU HAD AN ENDOSCOPIC PROCEDURE TODAY AT THE [facility]:   Refer to the procedure report that was given to you for any specific questions about what was found during the examination.  If the procedure report does not answer your questions, please call your gastroenterologist to clarify.  If you requested that your care partner not be given the details of your procedure findings, then the procedure report has been included in a sealed envelope for you to review at your convenience later.  YOU SHOULD EXPECT: Some feelings of bloating in the abdomen. Passage of more gas than usual.  Walking can help get rid of the air that was put into your GI tract during the procedure and reduce the bloating. If you had a lower endoscopy (such as a colonoscopy or flexible sigmoidoscopy) you may notice spotting of blood in your stool or on the toilet paper. If you underwent a bowel prep for your procedure, you may not have a normal bowel movement for a few days.  Please Note:  You might notice some irritation and congestion in your nose or some drainage.  This is from the oxygen used during your procedure.  There is no need for concern and it should clear up in a day or so.  SYMPTOMS TO REPORT IMMEDIATELY:  Following lower endoscopy (colonoscopy or flexible sigmoidoscopy):  Excessive amounts of blood in the stool  Significant tenderness or worsening of abdominal pains  Swelling of the abdomen that is new, acute  Fever of 100F or higher   For urgent or emergent issues, a gastroenterologist can be reached at any hour by calling [phone]. Do not use MyChart messaging for urgent concerns.    DIET:  We do recommend a small meal at first, but then you may proceed to your regular diet.  Drink plenty of fluids but you should avoid alcoholic beverages for 24 hours.  ACTIVITY:  You should plan to take it easy for the rest of today and you should NOT DRIVE or use  heavy machinery until tomorrow (because of the sedation medicines used during the test).    FOLLOW UP: Our staff will call the number listed on your records 48-72 hours following your procedure to check on you and address any questions or concerns that you may have regarding the information given to you following your procedure. If we do not reach you, we will leave a message.  We will attempt to reach you two times.  During this call, we will ask if you have developed any symptoms of COVID 19. If you develop any symptoms (ie: fever, flu-like symptoms, shortness of breath, cough etc.) before then, please call [phone].  If you test positive for Covid 19 in the 2 weeks post procedure, please call and report this information to us.    If any biopsies were taken you will be contacted by phone or by letter within the next 1-3 weeks.  Please call us at [phone] if you have not heard about the biopsies in 3 weeks.    SIGNATURES/CONFIDENTIALITY: You and/or your care partner have signed paperwork which will be entered into your electronic medical record.  These signatures attest to the fact that that the information above on your After Visit Summary has been reviewed and is understood.  Full responsibility of the confidentiality of this discharge information lies with you and/or your care-partner.  

## 2020-03-28 ENCOUNTER — Telehealth: Payer: Self-pay | Admitting: *Deleted

## 2020-03-28 NOTE — Telephone Encounter (Signed)
  Follow up Call-  Call back number 03/24/2020  Post procedure Call Back phone  # [phone]  Permission to leave phone message Yes  Some recent data might be hidden     Patient questions:  Do you have a fever, pain , or abdominal swelling? No. Pain Score  0 *  Have you tolerated food without any problems? Yes.    Have you been able to return to your normal activities? Yes.    Do you have any questions about your discharge instructions: Diet   No. Medications  No. Follow up visit  No.  Do you have questions or concerns about your Care? No.  Actions: * If pain score is 4 or above: No action needed, pain <4.  1. Have you developed a fever since your procedure? no  2.   Have you had an respiratory symptoms (SOB or cough) since your procedure? no  3.   Have you tested positive for COVID 19 since your procedure no  4.   Have you had any family members/close contacts diagnosed with the COVID 19 since your procedure? no   If yes to any of these questions please route to Laverna Peace, RN and Karlton Lemon, RN

## 2020-04-04 DIAGNOSIS — M47816 Spondylosis without myelopathy or radiculopathy, lumbar region: Secondary | ICD-10-CM | POA: Diagnosis not present

## 2020-04-21 DIAGNOSIS — M47816 Spondylosis without myelopathy or radiculopathy, lumbar region: Secondary | ICD-10-CM | POA: Diagnosis not present

## 2020-04-30 DIAGNOSIS — M5417 Radiculopathy, lumbosacral region: Secondary | ICD-10-CM | POA: Diagnosis not present

## 2020-05-03 ENCOUNTER — Ambulatory Visit (INDEPENDENT_AMBULATORY_CARE_PROVIDER_SITE_OTHER): Payer: Medicare HMO | Admitting: Family Medicine

## 2020-05-03 ENCOUNTER — Encounter: Payer: Self-pay | Admitting: Family Medicine

## 2020-05-03 ENCOUNTER — Other Ambulatory Visit: Payer: Self-pay

## 2020-05-03 VITALS — BP 135/80 | HR 83 | Temp 97.4°F | Ht 68.0 in | Wt 165.2 lb

## 2020-05-03 DIAGNOSIS — B029 Zoster without complications: Secondary | ICD-10-CM | POA: Diagnosis not present

## 2020-05-03 MED ORDER — LIDOCAINE 5 % EX OINT: 1.0000 "application " | TOPICAL_OINTMENT | Freq: Three times a day (TID) | CUTANEOUS | 0 refills | Status: DC | PRN

## 2020-05-03 NOTE — Progress Notes (Signed)
Acute Office Visit  Subjective:    Patient ID: Theresa Gamble, female    DOB: Oct 20, 1945, 75 y.o.   MRN: [id number]  Chief Complaint  Patient presents with  . Rash    HPI Patient is in today for a rash for 5-6 days. Theresa Gamble reports a rash on the left side of her neck that starts posterior and extends around to the front. She reports some burning prior to the start of the rash. She has had some scabbing. She reports that rash is very itchy with mild burning pain at times. She has tried benadryl cream and oral benadryl with some improvement. She has used hydrocortisone cream but this seemed to make it worse. She had chicken pox as a child. She has had the shingles vaccine. She denies fever or chills. Denies changes in vision or hearing.   Past Medical History:  Diagnosis Date  . Allergy    seasonal allergies  . Arthritis    back  . Cataract    bilateral sx  . Hypertension     Past Surgical History:  Procedure Laterality Date  . ABDOMINAL HYSTERECTOMY    . BREAST BIOPSY    . CHOLECYSTECTOMY    . EYE SURGERY Bilateral    cataract removal    Family History  Problem Relation Age of Onset  . Heart disease Mother   . Diabetes Mother   . Stroke Father   . Colon cancer Neg Hx   . Colon polyps Neg Hx   . Esophageal cancer Neg Hx   . Rectal cancer Neg Hx   . Stomach cancer Neg Hx     Social History   Socioeconomic History  . Marital status: Married    Spouse name: Sharon Seller  . Number of children: 2  . Years of education: 57  . Highest education level: High school graduate  Occupational History  . 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 Smoking status: Never Smoker  . Smokeless tobacco: Never Used  Vaping Use  . Vaping Use: Never used  Substance and Sexual Activity  . Alcohol use: Never  . Drug use: Never  . Sexual activity: Yes    Birth control/protection: Surgical  Other Topics Concern  . Not on file  Social  History Narrative  . Not on file   Social Determinants of Health   Financial Resource Strain: Low Risk   . Difficulty of Paying Living Expenses: Not hard at all  Food Insecurity: Not on file  Transportation Needs: No Transportation Needs  . Lack of Transportation (Medical): No  . Lack of Transportation (Non-Medical): No  Physical Activity: Inactive  . Days of Exercise per Week: 0 days  . Minutes of Exercise per Session: 0 min  Stress: No Stress Concern Present  . Feeling of Stress : Not at all  Social Connections: Moderately Integrated  . Frequency of Communication with Friends and Family: Three times a week  . Frequency of Social Gatherings with Friends and Family: Three times a week  . Attends Religious Services: 1 to 4 times per year  . Active Member of Clubs or Organizations: No  . Attends Banker Meetings: Never  . Marital Status: Married  Catering manager Violence: Not At Risk  . Fear of Current or Ex-Partner: No  . Emotionally Abused: No  . Physically Abused: No  . Sexually Abused: No    Outpatient Medications Prior to Visit  Medication Sig Dispense Refill  . aspirin EC 81 MG tablet Take 81 mg by mouth daily.    . calcium carbonate (OSCAL) 1500 (600 Ca) MG TABS tablet Take 600 mg of elemental calcium by mouth daily with breakfast.    . diclofenac Sodium (VOLTAREN) 1 % GEL Apply 2 g topically 4 (four) times daily. 350 g 3  . hydrochlorothiazide (HYDRODIURIL) 12.5 MG tablet Take 1 tablet (12.5 mg total) by mouth daily. 90 tablet 3  . Multiple Vitamin (MULTIVITAMIN) tablet Take 1 tablet by mouth daily.    . meloxicam (MOBIC) 7.5 MG tablet Take 1 tablet (7.5 mg total) by mouth daily. 30 tablet 0   Facility-Administered Medications Prior to Visit  Medication Dose Route Frequency Provider Last Rate Last Admin  . 0.9 %  sodium chloride infusion  500 mL Intravenous Once Pyrtle, Carie Caddy, MD        Allergies  Allergen Reactions  . Latex Rash    Review of  Systems As per HPI.    Objective:    Physical Exam Vitals and nursing note reviewed.  Constitutional:      General: She is not in acute distress.    Appearance: She is not ill-appearing.  HENT:     Head: Normocephalic and atraumatic.  Eyes:     Extraocular Movements: Extraocular movements intact.     Conjunctiva/sclera: Conjunctivae normal.     Pupils: Pupils are equal, round, and reactive to light.  Pulmonary:     Effort: Pulmonary effort is normal. No respiratory distress.  Musculoskeletal:     Right lower leg: No edema.     Left lower leg: No edema.  Skin:    General: Skin is warm and dry.     Findings: Rash present.     Comments: Erythematous vesicular rash along C4 dermatone of left side. Rash does not cross the midline. No surround erythema. No drainage.   Neurological:     General: No focal deficit present.     Mental Status: She is alert and oriented to person, place, and time.  Psychiatric:        Mood and Affect: Mood normal.        Behavior: Behavior normal.     BP 135/80   Pulse 83   Temp (!) 97.4 F (36.3 C) (Temporal)   Ht 5\' 8"  (1.727 m)   Wt 165 lb 4 oz (75 kg)   BMI 25.13 kg/m  Wt Readings from Last 3 Encounters:  05/03/20 165 lb 4 oz (75 kg)  03/24/20 162 lb (73.5 kg)  03/10/20 162 lb (73.5 kg)    There are no preventive care reminders to display for this patient.  There are no preventive care reminders to display for this patient.   Lab Results  Component Value Date   TSH 2.390 07/02/2019   Lab Results  Component Value Date   WBC 3.4 01/04/2020   HGB 12.7 01/04/2020   HCT 38.2 01/04/2020   MCV 91 01/04/2020   PLT 284 01/04/2020   Lab Results  Component Value Date   NA 143 01/04/2020   K 4.7 01/04/2020   CO2 25 01/04/2020   GLUCOSE 97 01/04/2020   BUN 17 01/04/2020   CREATININE 0.58 01/04/2020   BILITOT 0.2 01/04/2020   ALKPHOS 72 01/04/2020   AST 15 01/04/2020   ALT 10 01/04/2020   PROT 7.4 01/04/2020   ALBUMIN 3.9  01/04/2020   CALCIUM 9.6 01/04/2020   ANIONGAP 7 07/05/2017   Lab  Results  Component Value Date   CHOL 168 01/04/2020   Lab Results  Component Value Date   HDL 57 01/04/2020   Lab Results  Component Value Date   LDLCALC 96 01/04/2020   Lab Results  Component Value Date   TRIG 80 01/04/2020   Lab Results  Component Value Date   CHOLHDL 2.9 01/04/2020   Lab Results  Component Value Date   HGBA1C 6.0 01/04/2020       Assessment & Plan:   Theresa Gamble was seen today for rash.  Diagnoses and all orders for this visit:  Herpes zoster without complication >72 hr since rash onset. Rx for lidocaine cream as below. Tylenol or advil for pain. Continue benadryl. Avoid contact with immunocompromised individuals. Return to office for new or worsening symptoms, or if symptoms persist.  -     lidocaine (XYLOCAINE) 5 % ointment; Apply 1 application topically 3 (three) times daily as needed.  The patient indicates understanding of these issues and agrees with the plan.  Gabriel Earing, FNP

## 2020-05-03 NOTE — Patient Instructions (Signed)
Shingles  Shingles, which is also known as herpes zoster, is an infection that causes a painful skin rash and fluid-filled blisters. It is caused by a virus. Shingles only develops in people who:  Have had chickenpox.  Have been given a medicine to protect against chickenpox (have been vaccinated). Shingles is rare in this group. What are the causes? Shingles is caused by varicella-zoster virus (VZV). This is the same virus that causes chickenpox. After a person is exposed to VZV, the virus stays in the body in an inactive (dormant) state. Shingles develops if the virus is reactivated. This can happen many years after the first (initial) exposure to VZV. It is not known what causes this virus to be reactivated. What increases the risk? People who have had chickenpox or received the chickenpox vaccine are at risk for shingles. Shingles infection is more common in people who:  Are older than age 60.  Have a weakened disease-fighting system (immune system), such as people with: ? HIV. ? AIDS. ? Cancer.  Are taking medicines that weaken the immune system, such as transplant medicines.  Are experiencing a lot of stress. What are the signs or symptoms? Early symptoms of this condition include itching, tingling, and pain in an area on your skin. Pain may be described as burning, stabbing, or throbbing. A few days or weeks after early symptoms start, a painful red rash appears. The rash is usually on one side of the body and has a band-like or belt-like pattern. The rash eventually turns into fluid-filled blisters that break open, change into scabs, and dry up in about 2-3 weeks. At any time during the infection, you may also develop:  A fever.  Chills.  A headache.  An upset stomach. How is this diagnosed? This condition is diagnosed with a skin exam. Skin or fluid samples may be taken from the blisters before a diagnosis is made. These samples are examined under a microscope or sent to  a lab for testing. How is this treated? The rash may last for several weeks. There is not a specific cure for this condition. Your health care provider will probably prescribe medicines to help you manage pain, recover more quickly, and avoid long-term problems. Medicines may include:  Antiviral drugs.  Anti-inflammatory drugs.  Pain medicines.  Anti-itching medicines (antihistamines). If the area involved is on your face, you may be referred to a specialist, such as an eye doctor (ophthalmologist) or an ear, nose, and throat (ENT) doctor (otolaryngologist) to help you avoid eye problems, chronic pain, or disability. Follow these instructions at home: Medicines  Take over-the-counter and prescription medicines only as told by your health care provider.  Apply an anti-itch cream or numbing cream to the affected area as told by your health care provider. Relieving itching and discomfort  Apply cold, wet cloths (cold compresses) to the area of the rash or blisters as told by your health care provider.  Cool baths can be soothing. Try adding baking soda or dry oatmeal to the water to reduce itching. Do not bathe in hot water.   Blister and rash care  Keep your rash covered with a loose bandage (dressing). Wear loose-fitting clothing to help ease the pain of material rubbing against the rash.  Keep your rash and blisters clean by washing the area with mild soap and cool water as told by your health care provider.  Check your rash every day for signs of infection. Check for: ? More redness, swelling, or pain. ?   Fluid or blood. ? Warmth. ? Pus or a bad smell.  Do not scratch your rash or pick at your blisters. To help avoid scratching: ? Keep your fingernails clean and cut short. ? Wear gloves or mittens while you sleep, if scratching is a problem. General instructions  Rest as told by your health care provider.  Keep all follow-up visits as told by your health care provider. This  is important.  Wash your hands often with soap and water. If soap and water are not available, use hand sanitizer. Doing this lowers your chance of getting a bacterial skin infection.  Before your blisters change into scabs, your shingles infection can cause chickenpox in people who have never had it or have never been vaccinated against it. To prevent this from happening, avoid contact with other people, especially: ? Babies. ? Pregnant women. ? Children who have eczema. ? Elderly people who have transplants. ? People who have chronic illnesses, such as cancer or AIDS. Contact a health care provider if:  Your pain is not relieved with prescribed medicines.  Your pain does not get better after the rash heals.  You have signs of infection in the rash area, such as: ? More redness, swelling, or pain around the rash. ? Fluid or blood coming from the rash. ? The rash area feeling warm to the touch. ? Pus or a bad smell coming from the rash. Get help right away if:  The rash is on your face or nose.  You have facial pain, pain around your eye area, or loss of feeling on one side of your face.  You have difficulty seeing.  You have ear pain or have ringing in your ear.  You have a loss of taste.  Your condition gets worse. Summary  Shingles, which is also known as herpes zoster, is an infection that causes a painful skin rash and fluid-filled blisters.  This condition is diagnosed with a skin exam. Skin or fluid samples may be taken from the blisters and examined before the diagnosis is made.  Keep your rash covered with a loose bandage (dressing). Wear loose-fitting clothing to help ease the pain of material rubbing against the rash.  Before your blisters change into scabs, your shingles infection can cause chickenpox in people who have never had it or have never been vaccinated against it. This information is not intended to replace advice given to you by your health care  provider. Make sure you discuss any questions you have with your health care provider. Document Revised: 05/30/2018 Document Reviewed: 10/10/2016 Elsevier Patient Education  2021 Elsevier Inc.  

## 2020-05-19 DIAGNOSIS — M5417 Radiculopathy, lumbosacral region: Secondary | ICD-10-CM | POA: Diagnosis not present

## 2020-06-09 DIAGNOSIS — M5416 Radiculopathy, lumbar region: Secondary | ICD-10-CM | POA: Diagnosis not present

## 2020-06-16 DIAGNOSIS — M5416 Radiculopathy, lumbar region: Secondary | ICD-10-CM | POA: Insufficient documentation

## 2020-06-30 DIAGNOSIS — H52229 Regular astigmatism, unspecified eye: Secondary | ICD-10-CM | POA: Diagnosis not present

## 2020-06-30 DIAGNOSIS — Z0100 Encounter for examination of eyes and vision without abnormal findings: Secondary | ICD-10-CM | POA: Diagnosis not present

## 2020-07-02 DIAGNOSIS — M5416 Radiculopathy, lumbar region: Secondary | ICD-10-CM | POA: Diagnosis not present

## 2020-07-04 ENCOUNTER — Ambulatory Visit (INDEPENDENT_AMBULATORY_CARE_PROVIDER_SITE_OTHER): Payer: Medicare HMO | Admitting: Family Medicine

## 2020-07-04 ENCOUNTER — Encounter: Payer: Self-pay | Admitting: Family Medicine

## 2020-07-04 ENCOUNTER — Other Ambulatory Visit: Payer: Self-pay

## 2020-07-04 VITALS — BP 130/83 | HR 78 | Ht 68.0 in | Wt 165.0 lb

## 2020-07-04 DIAGNOSIS — M47816 Spondylosis without myelopathy or radiculopathy, lumbar region: Secondary | ICD-10-CM | POA: Diagnosis not present

## 2020-07-04 DIAGNOSIS — R7303 Prediabetes: Secondary | ICD-10-CM | POA: Diagnosis not present

## 2020-07-04 DIAGNOSIS — M545 Low back pain, unspecified: Secondary | ICD-10-CM

## 2020-07-04 DIAGNOSIS — M542 Cervicalgia: Secondary | ICD-10-CM

## 2020-07-04 DIAGNOSIS — E041 Nontoxic single thyroid nodule: Secondary | ICD-10-CM | POA: Diagnosis not present

## 2020-07-04 DIAGNOSIS — G8929 Other chronic pain: Secondary | ICD-10-CM

## 2020-07-04 DIAGNOSIS — I10 Essential (primary) hypertension: Secondary | ICD-10-CM

## 2020-07-04 LAB — BAYER DCA HB A1C WAIVED: HB A1C (BAYER DCA - WAIVED): 6.1 % (ref <7.0)

## 2020-07-04 MED ORDER — DICLOFENAC SODIUM 1 % EX GEL: 2.0000 g | Freq: Four times a day (QID) | CUTANEOUS | 3 refills | Status: DC

## 2020-07-04 NOTE — Progress Notes (Signed)
BP 130/83   Pulse 78   Ht 5\' 8"  (1.727 m)   Wt 165 lb (74.8 kg)   SpO2 98%   BMI 25.09 kg/m    Subjective:   Patient ID: , female    DOB: [birth year], 75 y.o.   MRN: [id number]  HPI: Theresa Gamble is a 75 y.o. female presenting on 07/04/2020 for No chief complaint on file.   HPI Prediabetes Patient comes in today for recheck of his diabetes. Patient has been currently taking no medication currently. Patient is not currently on an ACE inhibitor/ARB. Patient has not seen an ophthalmologist this year. Patient denies any issues with their feet. The symptom started onset as an adult hypertension ARE RELATED TO DM   Hypertension Patient is currently on hydrochlorothiazide, and their blood pressure today is 130/83. Patient denies any lightheadedness or dizziness. Patient denies headaches, blurred vision, chest pains, shortness of breath, or weakness. Denies any side effects from medication and is content with current medication.   Patient has history of right thyroid nodule, from looking back it looks like she never had follow-up for an ultrasound, as noted on CT scan.  Will order ultrasound and do thyroid testing.  Relevant past medical, surgical, family and social history reviewed and updated as indicated. Interim medical history since our last visit reviewed. Allergies and medications reviewed and updated.  Review of Systems  Constitutional: Negative for chills and fever.  Eyes: Negative for visual disturbance.  Respiratory: Negative for chest tightness and shortness of breath.   Cardiovascular: Negative for chest pain and leg swelling.  Musculoskeletal: Negative for back pain and gait problem.  Skin: Negative for rash.  Neurological: Negative for light-headedness and headaches.  Psychiatric/Behavioral: Negative for agitation and behavioral problems.  All other systems reviewed and are negative.   Per HPI unless specifically indicated above   Allergies as of  07/04/2020      Reactions   Latex Rash      Medication List       Accurate as of Jul 04, 2020  8:23 AM. If you have any questions, ask your nurse or doctor.        aspirin EC 81 MG tablet Take 81 mg by mouth daily.   calcium carbonate 1500 (600 Ca) MG Tabs tablet Commonly known as: OSCAL Take 600 mg of elemental calcium by mouth daily with breakfast.   diclofenac Sodium 1 % Gel Commonly known as: Voltaren Apply 2 g topically 4 (four) times daily.   hydrochlorothiazide 12.5 MG tablet Commonly known as: HYDRODIURIL Take 1 tablet (12.5 mg total) by mouth daily.   lidocaine 5 % ointment Commonly known as: XYLOCAINE Apply 1 application topically 3 (three) times daily as needed.   multivitamin tablet Take 1 tablet by mouth daily.        Objective:   BP 130/83   Pulse 78   Ht 5\' 8"  (1.727 m)   Wt 165 lb (74.8 kg)   SpO2 98%   BMI 25.09 kg/m   Wt Readings from Last 3 Encounters:  07/04/20 165 lb (74.8 kg)  05/03/20 165 lb 4 oz (75 kg)  03/24/20 162 lb (73.5 kg)    Physical Exam Vitals and nursing note reviewed.  Constitutional:      General: She is not in acute distress.    Appearance: She is well-developed. She is not diaphoretic.  Eyes:     Conjunctiva/sclera: Conjunctivae normal.  Cardiovascular:     Rate and Rhythm: Normal rate and regular  rhythm.     Heart sounds: Normal heart sounds. No murmur heard.   Pulmonary:     Effort: Pulmonary effort is normal. No respiratory distress.     Breath sounds: Normal breath sounds. No wheezing.  Musculoskeletal:        General: No swelling.  Skin:    General: Skin is warm and dry.     Findings: No rash.  Neurological:     Mental Status: She is alert and oriented to person, place, and time.     Coordination: Coordination normal.  Psychiatric:        Behavior: Behavior normal.       Assessment & Plan:   Problem List Items Addressed This Visit      Cardiovascular and Mediastinum   Hypertension    Relevant Orders   CMP14+EGFR   CBC with Differential/Platelet   Lipid panel     Endocrine   Right thyroid nodule   Relevant Orders   Thyroid Panel With TSH     Other   Prediabetes - Primary   Relevant Orders   Bayer DCA Hb A1c Waived   CMP14+EGFR   Lipid panel    Other Visit Diagnoses    Chronic bilateral low back pain without sciatica       Relevant Medications   diclofenac Sodium (VOLTAREN) 1 % GEL   Cervicalgia       Relevant Medications   diclofenac Sodium (VOLTAREN) 1 % GEL   Osteoarthritis of lumbar spine, unspecified spinal osteoarthritis complication status       Relevant Medications   diclofenac Sodium (VOLTAREN) 1 % GEL      refill Voltaren gel for neck pain and back pain, seems to be working. Will do labs and refill meds Follow up plan: Return in about 6 months (around 01/04/2021), or if symptoms worsen or fail to improve, for Htn and Prediabetes.  Counseling provided for all of the vaccine components No orders of the defined types were placed in this encounter.   Caryl Pina, MD [facility] 07/04/2020, 8:23 AM

## 2020-07-05 LAB — CMP14+EGFR
ALT: 14 IU/L (ref 0–32)
AST: 15 IU/L (ref 0–40)
Albumin/Globulin Ratio: 1.3 (ref 1.2–2.2)
Albumin: 4 g/dL (ref 3.7–4.7)
Alkaline Phosphatase: 65 IU/L (ref 44–121)
BUN/Creatinine Ratio: 24 (ref 12–28)
BUN: 16 mg/dL (ref 8–27)
Bilirubin Total: 0.3 mg/dL (ref 0.0–1.2)
CO2: 20 mmol/L (ref 20–29)
Calcium: 9.7 mg/dL (ref 8.7–10.3)
Chloride: 104 mmol/L (ref 96–106)
Creatinine, Ser: 0.68 mg/dL (ref 0.57–1.00)
Globulin, Total: 3.2 g/dL (ref 1.5–4.5)
Glucose: 92 mg/dL (ref 65–99)
Potassium: 4.4 mmol/L (ref 3.5–5.2)
Sodium: 141 mmol/L (ref 134–144)
Total Protein: 7.2 g/dL (ref 6.0–8.5)
eGFR: 92 mL/min/{1.73_m2} (ref >59)

## 2020-07-05 LAB — LIPID PANEL
Chol/HDL Ratio: 2.8 ratio (ref 0.0–4.4)
Cholesterol, Total: 196 mg/dL (ref 100–199)
HDL: 70 mg/dL (ref >39)
LDL Chol Calc (NIH): 107 mg/dL — ABNORMAL HIGH (ref 0–99)
Triglycerides: 109 mg/dL (ref 0–149)
VLDL Cholesterol Cal: 19 mg/dL (ref 5–40)

## 2020-07-05 LAB — THYROID PANEL WITH TSH
Free Thyroxine Index: 2.2 (ref 1.2–4.9)
T3 Uptake Ratio: 28 % (ref 24–39)
T4, Total: 8 ug/dL (ref 4.5–12.0)
TSH: 9.25 u[IU]/mL — ABNORMAL HIGH (ref 0.450–4.500)

## 2020-07-05 LAB — CBC WITH DIFFERENTIAL/PLATELET
Basophils Absolute: 0 10*3/uL (ref 0.0–0.2)
Basos: 1 %
EOS (ABSOLUTE): 0.1 10*3/uL (ref 0.0–0.4)
Eos: 2 %
Hematocrit: 39.6 % (ref 34.0–46.6)
Hemoglobin: 13.6 g/dL (ref 11.1–15.9)
Immature Grans (Abs): 0 10*3/uL (ref 0.0–0.1)
Immature Granulocytes: 0 %
Lymphocytes Absolute: 2.9 10*3/uL (ref 0.7–3.1)
Lymphs: 54 %
MCH: 31.1 pg (ref 26.6–33.0)
MCHC: 34.3 g/dL (ref 31.5–35.7)
MCV: 90 fL (ref 79–97)
Monocytes Absolute: 0.5 10*3/uL (ref 0.1–0.9)
Monocytes: 9 %
Neutrophils Absolute: 1.9 10*3/uL (ref 1.4–7.0)
Neutrophils: 34 %
Platelets: 267 10*3/uL (ref 150–450)
RBC: 4.38 x10E6/uL (ref 3.77–5.28)
RDW: 12.9 % (ref 11.7–15.4)
WBC: 5.4 10*3/uL (ref 3.4–10.8)

## 2020-07-11 ENCOUNTER — Ambulatory Visit (INDEPENDENT_AMBULATORY_CARE_PROVIDER_SITE_OTHER): Payer: Medicare HMO | Admitting: Nurse Practitioner

## 2020-07-11 ENCOUNTER — Other Ambulatory Visit: Payer: Self-pay

## 2020-07-11 ENCOUNTER — Encounter: Payer: Self-pay | Admitting: Nurse Practitioner

## 2020-07-11 VITALS — BP 138/88 | HR 82 | Temp 97.0°F | Ht 68.0 in | Wt 166.0 lb

## 2020-07-11 DIAGNOSIS — R21 Rash and other nonspecific skin eruption: Secondary | ICD-10-CM | POA: Diagnosis not present

## 2020-07-11 MED ORDER — METHYLPREDNISOLONE ACETATE 40 MG/ML IJ SUSP
40.0000 mg | Freq: Once | INTRAMUSCULAR | Status: AC
  Administered 2020-07-11: 40 mg via INTRAMUSCULAR

## 2020-07-11 MED ORDER — PREDNISONE 10 MG (21) PO TBPK: ORAL_TABLET | ORAL | 0 refills | Status: DC

## 2020-07-11 MED ORDER — BENADRYL ITCH STOPPING 1-0.1 % EX CREA: TOPICAL_CREAM | Freq: Three times a day (TID) | CUTANEOUS | 0 refills | Status: DC | PRN

## 2020-07-11 NOTE — Assessment & Plan Note (Signed)
New skin rash, worsening in the last few days, on assessment, rash does not look like classic shingles but spread across half of upper back.  Depo-Medrol shot given in clinic, prednisone taper.  Antihistamine cream for itch. Follow-up with worsening unresolved symptoms.

## 2020-07-11 NOTE — Progress Notes (Signed)
Acute Office Visit  Subjective:    Patient ID: Theresa Gamble, female    DOB: Feb 04, 1946, 75 y.o.   MRN: [id number]  Chief Complaint  Patient presents with  . Rash    Rash This is a new problem. The current episode started in the past 7 days. The problem is unchanged. The affected locations include the back and neck. The rash is characterized by redness and itchiness. Associated with: latex. Past treatments include antihistamine. The treatment provided mild relief.     Past Medical History:  Diagnosis Date  . Allergy    seasonal allergies  . Arthritis    back  . Cataract    bilateral sx  . Hypertension     Past Surgical History:  Procedure Laterality Date  . ABDOMINAL HYSTERECTOMY    . BREAST BIOPSY    . CHOLECYSTECTOMY    . EYE SURGERY Bilateral    cataract removal    Family History  Problem Relation Age of Onset  . Heart disease Mother   . Diabetes Mother   . Stroke Father   . Colon cancer Neg Hx   . Colon polyps Neg Hx   . Esophageal cancer Neg Hx   . Rectal cancer Neg Hx   . Stomach cancer Neg Hx     Social History   Socioeconomic History  . Marital status: Married    Spouse name: Sharon Seller  . Number of children: 2  . Years of education: 50  . Highest education level: High school graduate  Occupational History  . 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 Smoking status: Never Smoker  . Smokeless tobacco: Never Used  Vaping Use  . Vaping Use: Never used  Substance and Sexual Activity  . Alcohol use: Never  . Drug use: Never  . Sexual activity: Yes    Birth control/protection: Surgical  Other Topics Concern  . Not on file  Social History Narrative  . Not on file   Social Determinants of Health   Financial Resource Strain: Low Risk   . Difficulty of Paying Living Expenses: Not hard at all  Food Insecurity: Not on file  Transportation Needs: No Transportation Needs  . Lack of  Transportation (Medical): No  . Lack of Transportation (Non-Medical): No  Physical Activity: Inactive  . Days of Exercise per Week: 0 days  . Minutes of Exercise per Session: 0 min  Stress: No Stress Concern Present  . Feeling of Stress : Not at all  Social Connections: Moderately Integrated  . Frequency of Communication with Friends and Family: Three times a week  . Frequency of Social Gatherings with Friends and Family: Three times a week  . Attends Religious Services: 1 to 4 times per year  . Active Member of Clubs or Organizations: No  . Attends Banker Meetings: Never  . Marital Status: Married  Catering manager Violence: Not At Risk  . Fear of Current or Ex-Partner: No  . Emotionally Abused: No  . Physically Abused: No  . Sexually Abused: No    Outpatient Medications Prior to Visit  Medication Sig Dispense Refill  . aspirin EC 81 MG tablet Take 81 mg by mouth daily.    . calcium carbonate (OSCAL) 1500 (600 Ca) MG TABS tablet Take 600 mg of elemental calcium by mouth daily with breakfast.    . diclofenac Sodium (VOLTAREN) 1 % GEL Apply 2 g topically 4 (four)  times daily. Give 90 day supply 750 g 3  . hydrochlorothiazide (HYDRODIURIL) 12.5 MG tablet Take 1 tablet (12.5 mg total) by mouth daily. 90 tablet 3  . lidocaine (XYLOCAINE) 5 % ointment Apply 1 application topically 3 (three) times daily as needed. 50 g 0  . Multiple Vitamin (MULTIVITAMIN) tablet Take 1 tablet by mouth daily.     Facility-Administered Medications Prior to Visit  Medication Dose Route Frequency Provider Last Rate Last Admin  . 0.9 %  sodium chloride infusion  500 mL Intravenous Once Pyrtle, Lajuan Lines, MD        Allergies  Allergen Reactions  . Latex Rash    Review of Systems  Constitutional: Negative.   HENT: Negative.   Eyes: Negative.   Respiratory: Negative.   Cardiovascular: Negative.   Gastrointestinal: Negative.   Genitourinary: Negative.   Skin: Positive for rash.  All other  systems reviewed and are negative.      Objective:    Physical Exam Vitals reviewed.  Constitutional:      Appearance: Normal appearance.  HENT:     Head: Normocephalic.     Nose: Nose normal.  Eyes:     Conjunctiva/sclera: Conjunctivae normal.  Cardiovascular:     Rate and Rhythm: Normal rate and regular rhythm.     Pulses: Normal pulses.     Heart sounds: Normal heart sounds.  Pulmonary:     Effort: Pulmonary effort is normal.     Breath sounds: Normal breath sounds.  Abdominal:     General: Bowel sounds are normal.  Skin:    Findings: Rash present.  Neurological:     Mental Status: She is alert and oriented to person, place, and time.  Psychiatric:        Behavior: Behavior normal.     BP 138/88   Pulse 82   Temp (!) 97 F (36.1 C) (Temporal)   Ht $R'5\' 8"'Fv$  (1.727 m)   Wt 166 lb (75.3 kg)   SpO2 98%   BMI 25.24 kg/m  Wt Readings from Last 3 Encounters:  07/11/20 166 lb (75.3 kg)  07/04/20 165 lb (74.8 kg)  05/03/20 165 lb 4 oz (75 kg)     Lab Results  Component Value Date   TSH 9.250 (H) 07/04/2020   Lab Results  Component Value Date   WBC 5.4 07/04/2020   HGB 13.6 07/04/2020   HCT 39.6 07/04/2020   MCV 90 07/04/2020   PLT 267 07/04/2020   Lab Results  Component Value Date   NA 141 07/04/2020   K 4.4 07/04/2020   CO2 20 07/04/2020   GLUCOSE 92 07/04/2020   BUN 16 07/04/2020   CREATININE 0.68 07/04/2020   BILITOT 0.3 07/04/2020   ALKPHOS 65 07/04/2020   AST 15 07/04/2020   ALT 14 07/04/2020   PROT 7.2 07/04/2020   ALBUMIN 4.0 07/04/2020   CALCIUM 9.7 07/04/2020   ANIONGAP 7 07/05/2017   EGFR 92 07/04/2020   Lab Results  Component Value Date   CHOL 196 07/04/2020   Lab Results  Component Value Date   HDL 70 07/04/2020   Lab Results  Component Value Date   LDLCALC 107 (H) 07/04/2020   Lab Results  Component Value Date   TRIG 109 07/04/2020   Lab Results  Component Value Date   CHOLHDL 2.8 07/04/2020   Lab Results   Component Value Date   HGBA1C 6.1 07/04/2020       Assessment & Plan:  Rash New skin rash,  worsening in the last few days, on assessment, rash does not look like classic shingles but spread across half of upper back.  Depo-Medrol shot given in clinic, prednisone taper.  Antihistamine cream for itch. Follow-up with worsening unresolved symptoms.  Problem List Items Addressed This Visit   None      No orders of the defined types were placed in this encounter.    Ivy Lynn, NP

## 2020-07-11 NOTE — Patient Instructions (Signed)
Rash, Adult  A rash is a change in the color of your skin. A rash can also change the way your skin feels. There are many different conditions and factors that can cause a rash. Follow these instructions at home: The goal of treatment is to stop the itching and keep the rash from spreading. Watch for any changes in your symptoms. Let your doctor know about them. Follow these instructions to help with your condition: Medicine Take or apply over-the-counter and prescription medicines only as told by your doctor. These may include medicines:  To treat red or swollen skin (corticosteroid creams).  To treat itching.  To treat an allergy (oral antihistamines).  To treat very bad symptoms (oral corticosteroids).   Skin care  Put cool cloths (compresses) on the affected areas.  Do not scratch or rub your skin.  Avoid covering the rash. Make sure that the rash is exposed to air as much as possible. Managing itching and discomfort  Avoid hot showers or baths. These can make itching worse. A cold shower may help.  Try taking a bath with: ? Epsom salts. You can get these at your local pharmacy or grocery store. Follow the instructions on the package. ? Baking soda. Pour a small amount into the bath as told by your doctor. ? Colloidal oatmeal. You can get this at your local pharmacy or grocery store. Follow the instructions on the package.  Try putting baking soda paste onto your skin. Stir water into baking soda until it gets like a paste.  Try putting on a lotion that relieves itchiness (calamine lotion).  Keep cool and out of the sun. Sweating and being hot can make itching worse. General instructions  Rest as needed.  Drink enough fluid to keep your pee (urine) pale yellow.  Wear loose-fitting clothing.  Avoid scented soaps, detergents, and perfumes. Use gentle soaps, detergents, perfumes, and other cosmetic products.  Avoid anything that causes your rash. Keep a journal to help  track what causes your rash. Write down: ? What you eat. ? What cosmetic products you use. ? What you drink. ? What you wear. This includes jewelry.  Keep all follow-up visits as told by your doctor. This is important.   Contact a doctor if:  You sweat at night.  You lose weight.  You pee (urinate) more than normal.  You pee less than normal, or you notice that your pee is a darker color than normal.  You feel weak.  You throw up (vomit).  Your skin or the whites of your eyes look yellow (jaundice).  Your skin: ? Tingles. ? Is numb.  Your rash: ? Does not go away after a few days. ? Gets worse.  You are: ? More thirsty than normal. ? More tired than normal.  You have: ? New symptoms. ? Pain in your belly (abdomen). ? A fever. ? Watery poop (diarrhea). Get help right away if:  You have a fever and your symptoms suddenly get worse.  You start to feel mixed up (confused).  You have a very bad headache or a stiff neck.  You have very bad joint pains or stiffness.  You have jerky movements that you cannot control (seizure).  Your rash covers all or most of your body. The rash may or may not be painful.  You have blisters that: ? Are on top of the rash. ? Grow larger. ? Grow together. ? Are painful. ? Are inside your nose or mouth.  You have   a rash that: ? Looks like purple pinprick-sized spots all over your body. ? Has a "bull's eye" or looks like a target. ? Is red and painful, causes your skin to peel, and is not from being in the sun too long. Summary  A rash is a change in the color of your skin. A rash can also change the way your skin feels.  The goal of treatment is to stop the itching and keep the rash from spreading.  Take or apply over-the-counter and prescription medicines only as told by your doctor.  Contact a doctor if you have new symptoms or symptoms that get worse.  Keep all follow-up visits as told by your doctor. This is  important. This information is not intended to replace advice given to you by your health care provider. Make sure you discuss any questions you have with your health care provider. Document Revised: 05/30/2018 Document Reviewed: 09/09/2017 Elsevier Patient Education  2021 Elsevier Inc.  

## 2020-07-14 DIAGNOSIS — M5416 Radiculopathy, lumbar region: Secondary | ICD-10-CM | POA: Diagnosis not present

## 2020-08-04 ENCOUNTER — Telehealth: Payer: Self-pay | Admitting: Family Medicine

## 2020-08-04 NOTE — Telephone Encounter (Signed)
Pt called and given appt with PCP tomorrow

## 2020-08-04 NOTE — Telephone Encounter (Signed)
Pt said that she is still having a rash and that she was seen by Je on 6/6. Based on her chart she was seen by Lequita Halt on 3/15 for a rash and on 5/23 by Je for shingles. She says it was not either of these appts and that she was seen last Monday 6/6 by [facility] and she was prescribed something that was not helping. Pt told that she will most likely need appt but wanted to talk to someone else first. Please call back and advise.

## 2020-08-05 ENCOUNTER — Encounter: Payer: Self-pay | Admitting: Family Medicine

## 2020-08-05 ENCOUNTER — Ambulatory Visit: Payer: Medicare HMO | Admitting: Nurse Practitioner

## 2020-08-05 ENCOUNTER — Other Ambulatory Visit: Payer: Self-pay

## 2020-08-05 ENCOUNTER — Ambulatory Visit (INDEPENDENT_AMBULATORY_CARE_PROVIDER_SITE_OTHER): Payer: Medicare HMO | Admitting: Family Medicine

## 2020-08-05 VITALS — BP 138/84 | HR 88 | Ht 68.0 in | Wt 164.0 lb

## 2020-08-05 DIAGNOSIS — L2089 Other atopic dermatitis: Secondary | ICD-10-CM

## 2020-08-05 MED ORDER — PREDNISONE 20 MG PO TABS: ORAL_TABLET | ORAL | 0 refills | Status: DC

## 2020-08-05 MED ORDER — METHYLPREDNISOLONE ACETATE 40 MG/ML IJ SUSP
80.0000 mg | Freq: Once | INTRAMUSCULAR | Status: AC
  Administered 2020-08-05: 80 mg via INTRAMUSCULAR

## 2020-08-05 NOTE — Progress Notes (Signed)
BP 138/84   Pulse 88   Ht 5\' 8"  (1.727 m)   Wt 164 lb (74.4 kg)   SpO2 96%   BMI 24.94 kg/m    Subjective:   Patient ID: , female    DOB: [birth year], 75 y.o.   MRN: [id number]  HPI: Theresa Gamble is a 75 y.o. female presenting on 08/05/2020 for Rash (Back-itching)   HPI Itchy red rash on her back Patient comes in complaining of an itchy red rash on her back.  She was seen by one of our colleagues on 07/11/2020 for this rash and she has not had it at that time.  It started in her upper back and is spread to her whole back now.  She denies any fevers or chills or pain but does have a lot of pruritus and the rashes all the way up and down her back but denies it being anywhere else, not on her chest hands arms legs feet face, it is just on her back.  Relevant past medical, surgical, family and social history reviewed and updated as indicated. Interim medical history since our last visit reviewed. Allergies and medications reviewed and updated.  Review of Systems  Constitutional:  Negative for chills and fever.  Eyes:  Negative for visual disturbance.  Respiratory:  Negative for chest tightness and shortness of breath.   Cardiovascular:  Negative for chest pain and leg swelling.  Skin:  Positive for rash.  Neurological:  Negative for light-headedness and headaches.  Psychiatric/Behavioral:  Negative for agitation and behavioral problems.   All other systems reviewed and are negative.  Per HPI unless specifically indicated above   Allergies as of 08/05/2020       Reactions   Latex Rash        Medication List        Accurate as of August 05, 2020  9:24 AM. If you have any questions, ask your nurse or doctor.          STOP taking these medications    Benadryl Itch Stopping cream Generic drug: diphenhydrAMINE-zinc acetate Stopped by: August 07, 2020 Prabhjot Maddux, MD   lidocaine 5 % ointment Commonly known as: XYLOCAINE Stopped by: Elige Radon Wilber Fini, MD    predniSONE 10 MG (21) Tbpk tablet Commonly known as: STERAPRED UNI-PAK 21 TAB Replaced by: predniSONE 20 MG tablet Stopped by: Elige Radon Cadon Raczka, MD       TAKE these medications    aspirin EC 81 MG tablet Take 81 mg by mouth daily.   calcium carbonate 1500 (600 Ca) MG Tabs tablet Commonly known as: OSCAL Take 600 mg of elemental calcium by mouth daily with breakfast.   diclofenac Sodium 1 % Gel Commonly known as: Voltaren Apply 2 g topically 4 (four) times daily. Give 90 day supply   hydrochlorothiazide 12.5 MG tablet Commonly known as: HYDRODIURIL Take 1 tablet (12.5 mg total) by mouth daily.   multivitamin tablet Take 1 tablet by mouth daily.   predniSONE 20 MG tablet Commonly known as: DELTASONE Take 3 tabs daily for 1 week, then 2 tabs daily for week 2, then 1 tab daily for week 3. Replaces: predniSONE 10 MG (21) Tbpk tablet Started by: Elige Radon Ahilyn Nell, MD         Objective:   BP 138/84   Pulse 88   Ht 5\' 8"  (1.727 m)   Wt 164 lb (74.4 kg)   SpO2 96%   BMI 24.94 kg/m   Wt Readings from Last 3 Encounters:  08/05/20 164 lb (74.4 kg)  07/11/20 166 lb (75.3 kg)  07/04/20 165 lb (74.8 kg)    Physical Exam Vitals and nursing note reviewed.  Constitutional:      Appearance: Normal appearance.  Skin:    Findings: Rash (Multiple find pink papules over her whole back with coalescing and irritation between them.  No purulence or drainage, no scaliness or plaques) present.     Comments: No rash anywhere, covers almost the entire back from the shoulders down to her waistline.  Neurological:     Mental Status: She is alert.      Assessment & Plan:   Problem List Items Addressed This Visit   None Visit Diagnoses     Other atopic dermatitis    -  Primary   Relevant Medications   methylPREDNISolone acetate (DEPO-MEDROL) injection 80 mg (Start on 08/05/2020  9:30 AM)   predniSONE (DELTASONE) 20 MG tablet       Still has a parents of contact or  irritant dermatitis even though we cannot find the source, will give a stronger course of steroids with an injection to see if we can calm it down and get it to go away.  If not may consider dermatology. Follow up plan: Return if symptoms worsen or fail to improve.  Counseling provided for all of the vaccine components No orders of the defined types were placed in this encounter.   Arville Care, MD [facility] 08/05/2020, 9:24 AM

## 2020-12-09 ENCOUNTER — Other Ambulatory Visit: Payer: Self-pay | Admitting: Family Medicine

## 2020-12-09 DIAGNOSIS — Z1231 Encounter for screening mammogram for malignant neoplasm of breast: Secondary | ICD-10-CM

## 2020-12-27 ENCOUNTER — Other Ambulatory Visit: Payer: Self-pay | Admitting: Family Medicine

## 2020-12-28 DIAGNOSIS — H903 Sensorineural hearing loss, bilateral: Secondary | ICD-10-CM | POA: Diagnosis not present

## 2021-01-06 ENCOUNTER — Other Ambulatory Visit: Payer: Self-pay

## 2021-01-06 ENCOUNTER — Ambulatory Visit (INDEPENDENT_AMBULATORY_CARE_PROVIDER_SITE_OTHER): Payer: Medicare HMO | Admitting: Family Medicine

## 2021-01-06 ENCOUNTER — Encounter: Payer: Self-pay | Admitting: Family Medicine

## 2021-01-06 VITALS — BP 133/82 | HR 87 | Ht 68.0 in | Wt 164.0 lb

## 2021-01-06 DIAGNOSIS — R7303 Prediabetes: Secondary | ICD-10-CM | POA: Diagnosis not present

## 2021-01-06 DIAGNOSIS — I10 Essential (primary) hypertension: Secondary | ICD-10-CM

## 2021-01-06 DIAGNOSIS — E041 Nontoxic single thyroid nodule: Secondary | ICD-10-CM | POA: Diagnosis not present

## 2021-01-06 LAB — BAYER DCA HB A1C WAIVED: HB A1C (BAYER DCA - WAIVED): 5.8 % — ABNORMAL HIGH (ref 4.8–5.6)

## 2021-01-06 MED ORDER — HYDROCHLOROTHIAZIDE 12.5 MG PO TABS: 12.5000 mg | ORAL_TABLET | Freq: Every day | ORAL | 3 refills | Status: DC

## 2021-01-06 NOTE — Progress Notes (Signed)
BP 133/82   Pulse 87   Ht 5\' 8"  (1.727 m)   Wt 164 lb (74.4 kg)   SpO2 96%   BMI 24.94 kg/m    Subjective:   Patient ID: , female    DOB: 1946/02/12, 75 y.o.   MRN: [id number]  HPI: Theresa Gamble is a 75 y.o. female presenting on 01/06/2021 for Medical Management of Chronic Issues, Hyperlipidemia, Hypertension, and Prediabetes   HPI Prediabetes Patient comes in today for recheck of his diabetes. Patient has been currently taking No medication currently. Patient is not currently on an ACE inhibitor/ARB. Patient has not seen an ophthalmologist this year. Patient Denies any issues with their feet. The symptom started onset as an adult Hypertension and hyperlipidemia ARE RELATED TO DM   Hypertension Patient is currently on Hydrochlorothiazide, and their blood pressure today is 133/82. Patient denies any lightheadedness or dizziness. Patient denies headaches, blurred vision, chest pains, shortness of breath, or weakness. Denies any side effects from medication and is content with current medication.   Hyperlipidemia Patient is coming in for recheck of his hyperlipidemia. The patient is currently taking No medication currently is diet controlled, will check blood work. They deny any issues with myalgias or history of liver damage from it. They deny any focal numbness or weakness or chest pain.   Relevant past medical, surgical, family and social history reviewed and updated as indicated. Interim medical history since our last visit reviewed. Allergies and medications reviewed and updated.  Review of Systems  Constitutional:  Negative for chills and fever.  Eyes:  Negative for visual disturbance.  Respiratory:  Negative for chest tightness and shortness of breath.   Cardiovascular:  Negative for chest pain and leg swelling.  Genitourinary:  Negative for difficulty urinating and dysuria.  Musculoskeletal:  Positive for arthralgias and back pain. Negative for gait problem.   Skin:  Negative for rash.  Neurological:  Negative for light-headedness and headaches.  Psychiatric/Behavioral:  Negative for agitation and behavioral problems.   All other systems reviewed and are negative.  Per HPI unless specifically indicated above   Allergies as of 01/06/2021       Reactions   Latex Rash        Medication List        Accurate as of January 06, 2021  8:38 AM. If you have any questions, ask your nurse or doctor.          STOP taking these medications    predniSONE 20 MG tablet Commonly known as: DELTASONE Stopped by: January 08, 2021 Theresa Coello, MD       TAKE these medications    aspirin EC 81 MG tablet Take 81 mg by mouth daily.   calcium carbonate 1500 (600 Ca) MG Tabs tablet Commonly known as: OSCAL Take 600 mg of elemental calcium by mouth daily with breakfast.   diclofenac Sodium 1 % Gel Commonly known as: Voltaren Apply 2 g topically 4 (four) times daily. Give 90 day supply   hydrochlorothiazide 12.5 MG tablet Commonly known as: HYDRODIURIL Take 1 tablet (12.5 mg total) by mouth daily.   multivitamin tablet Take 1 tablet by mouth daily.         Objective:   BP 133/82   Pulse 87   Ht 5\' 8"  (1.727 m)   Wt 164 lb (74.4 kg)   SpO2 96%   BMI 24.94 kg/m   Wt Readings from Last 3 Encounters:  01/06/21 164 lb (74.4 kg)  08/05/20 164 lb (74.4  kg)  07/11/20 166 lb (75.3 kg)    Physical Exam Vitals and nursing note reviewed.  Constitutional:      General: She is not in acute distress.    Appearance: She is well-developed. She is not diaphoretic.  Eyes:     Conjunctiva/sclera: Conjunctivae normal.  Cardiovascular:     Rate and Rhythm: Normal rate and regular rhythm.     Heart sounds: Normal heart sounds. No murmur heard. Pulmonary:     Effort: Pulmonary effort is normal. No respiratory distress.     Breath sounds: Normal breath sounds. No wheezing.  Musculoskeletal:        General: No swelling. Normal range of motion.   Skin:    General: Skin is warm and dry.     Findings: No rash.  Neurological:     Mental Status: She is alert and oriented to person, place, and time.     Coordination: Coordination normal.  Psychiatric:        Behavior: Behavior normal.      Assessment & Plan:   Problem List Items Addressed This Visit       Cardiovascular and Mediastinum   Hypertension - Primary   Relevant Medications   hydrochlorothiazide (HYDRODIURIL) 12.5 MG tablet   Other Relevant Orders   CBC with Differential/Platelet   CMP14+EGFR   Lipid panel   TSH   Bayer DCA Hb A1c Waived     Endocrine   Right thyroid nodule   Relevant Orders   US THYROID     Other   Prediabetes   Relevant Orders   CBC with Differential/Platelet   CMP14+EGFR   Lipid panel   TSH   Bayer DCA Hb A1c Waived    A1c is 5.8, looks good, blood pressure looks good.  No change medication.  Follow-up in 6 months Follow up plan: Return in about 6 months (around 07/06/2021), or if symptoms worsen or fail to improve, for Physical and prediabetes and hypertension.  Counseling provided for all of the vaccine components Orders Placed This Encounter  Procedures   US THYROID   CBC with Differential/Platelet   CMP14+EGFR   Lipid panel   TSH   Bayer DCA Hb A1c Waived    Theresa Pina, MD [facility] 01/06/2021, 8:38 AM

## 2021-01-07 LAB — CMP14+EGFR
ALT: 10 IU/L (ref 0–32)
AST: 17 IU/L (ref 0–40)
Albumin/Globulin Ratio: 1.4 (ref 1.2–2.2)
Albumin: 4.1 g/dL (ref 3.7–4.7)
Alkaline Phosphatase: 63 IU/L (ref 44–121)
BUN/Creatinine Ratio: 29 — ABNORMAL HIGH (ref 12–28)
BUN: 19 mg/dL (ref 8–27)
Bilirubin Total: 0.4 mg/dL (ref 0.0–1.2)
CO2: 25 mmol/L (ref 20–29)
Calcium: 10.4 mg/dL — ABNORMAL HIGH (ref 8.7–10.3)
Chloride: 102 mmol/L (ref 96–106)
Creatinine, Ser: 0.65 mg/dL (ref 0.57–1.00)
Globulin, Total: 3 g/dL (ref 1.5–4.5)
Glucose: 102 mg/dL — ABNORMAL HIGH (ref 70–99)
Potassium: 4.2 mmol/L (ref 3.5–5.2)
Sodium: 140 mmol/L (ref 134–144)
Total Protein: 7.1 g/dL (ref 6.0–8.5)
eGFR: 93 mL/min/{1.73_m2} (ref >59)

## 2021-01-07 LAB — CBC WITH DIFFERENTIAL/PLATELET
Basophils Absolute: 0 10*3/uL (ref 0.0–0.2)
Basos: 1 %
EOS (ABSOLUTE): 0.2 10*3/uL (ref 0.0–0.4)
Eos: 5 %
Hematocrit: 40.1 % (ref 34.0–46.6)
Hemoglobin: 13.3 g/dL (ref 11.1–15.9)
Immature Grans (Abs): 0 10*3/uL (ref 0.0–0.1)
Immature Granulocytes: 0 %
Lymphocytes Absolute: 1.7 10*3/uL (ref 0.7–3.1)
Lymphs: 41 %
MCH: 30.2 pg (ref 26.6–33.0)
MCHC: 33.2 g/dL (ref 31.5–35.7)
MCV: 91 fL (ref 79–97)
Monocytes Absolute: 0.6 10*3/uL (ref 0.1–0.9)
Monocytes: 15 %
Neutrophils Absolute: 1.5 10*3/uL (ref 1.4–7.0)
Neutrophils: 38 %
Platelets: 287 10*3/uL (ref 150–450)
RBC: 4.41 x10E6/uL (ref 3.77–5.28)
RDW: 12.6 % (ref 11.7–15.4)
WBC: 4 10*3/uL (ref 3.4–10.8)

## 2021-01-07 LAB — TSH: TSH: 4.67 u[IU]/mL — ABNORMAL HIGH (ref 0.450–4.500)

## 2021-01-07 LAB — LIPID PANEL
Chol/HDL Ratio: 2.6 ratio (ref 0.0–4.4)
Cholesterol, Total: 182 mg/dL (ref 100–199)
HDL: 70 mg/dL (ref >39)
LDL Chol Calc (NIH): 97 mg/dL (ref 0–99)
Triglycerides: 81 mg/dL (ref 0–149)
VLDL Cholesterol Cal: 15 mg/dL (ref 5–40)

## 2021-01-09 MED ORDER — LEVOTHYROXINE SODIUM 50 MCG PO TABS: 50.0000 ug | ORAL_TABLET | Freq: Every day | ORAL | 1 refills | Status: DC

## 2021-01-09 NOTE — Addendum Note (Signed)
Addended by: Lorelee Cover C on: 01/09/2021 12:18 PM   Modules accepted: Orders

## 2021-01-10 DIAGNOSIS — M5416 Radiculopathy, lumbar region: Secondary | ICD-10-CM | POA: Diagnosis not present

## 2021-01-17 ENCOUNTER — Other Ambulatory Visit: Payer: Self-pay

## 2021-01-17 ENCOUNTER — Ambulatory Visit (HOSPITAL_COMMUNITY)
Admission: RE | Admit: 2021-01-17 | Discharge: 2021-01-17 | Disposition: A | Discharge status: Home / Self Care | Payer: Medicare HMO | Source: Ambulatory Visit | Attending: Family Medicine | Admitting: Family Medicine

## 2021-01-17 DIAGNOSIS — E041 Nontoxic single thyroid nodule: Secondary | ICD-10-CM | POA: Insufficient documentation

## 2021-01-19 ENCOUNTER — Ambulatory Visit: Payer: Medicare HMO

## 2021-01-20 ENCOUNTER — Ambulatory Visit
Admission: RE | Admit: 2021-01-20 | Discharge: 2021-01-20 | Disposition: A | Discharge status: Home / Self Care | Payer: Medicare HMO | Source: Ambulatory Visit | Attending: Family Medicine | Admitting: Family Medicine

## 2021-01-20 ENCOUNTER — Other Ambulatory Visit: Payer: Self-pay

## 2021-01-20 DIAGNOSIS — Z1231 Encounter for screening mammogram for malignant neoplasm of breast: Secondary | ICD-10-CM | POA: Diagnosis not present

## 2021-01-24 DIAGNOSIS — H903 Sensorineural hearing loss, bilateral: Secondary | ICD-10-CM | POA: Diagnosis not present

## 2021-01-31 DIAGNOSIS — M5136 Other intervertebral disc degeneration, lumbar region: Secondary | ICD-10-CM | POA: Diagnosis not present

## 2021-01-31 DIAGNOSIS — M5030 Other cervical disc degeneration, unspecified cervical region: Secondary | ICD-10-CM | POA: Diagnosis not present

## 2021-02-08 ENCOUNTER — Ambulatory Visit (INDEPENDENT_AMBULATORY_CARE_PROVIDER_SITE_OTHER): Payer: Medicare HMO

## 2021-02-08 VITALS — Ht 68.0 in | Wt 164.0 lb

## 2021-02-08 DIAGNOSIS — Z0000 Encounter for general adult medical examination without abnormal findings: Secondary | ICD-10-CM

## 2021-02-08 NOTE — Patient Instructions (Signed)
Theresa Gamble , Thank you for taking time to come for your Medicare Wellness Visit. I appreciate your ongoing commitment to your health goals. Please review the following plan we discussed and let me know if I can assist you in the future.   Screening recommendations/referrals: Colonoscopy: Done 03/24/2020- likely no need to repeat Mammogram: Done 01/20/2021 - Repeat annually  Bone Density: Done 01/04/2020 - Repeat every 2 years  Recommended yearly ophthalmology/optometry visit for glaucoma screening and checkup Recommended yearly dental visit for hygiene and checkup  Vaccinations: Influenza vaccine: Done 12/06/2020 - Repeat annually  Pneumococcal vaccine: Done 12/18/2016 & 10/28/2018 Tdap vaccine: Done 12/18/2016 - Repeat in 10 years  Shingles vaccine: Done 06/11/2017 & 08/13/2017   Covid-19: Done 03/08/19, 03/26/19, 11/05/2019, 06/30/20, & 12/1/222  Advanced directives: Advance directive discussed with you today. Even though you declined this today, please call our office should you change your mind, and we can give you the proper paperwork for you to fill out.   Conditions/risks identified: Aim for 30 minutes of exercise or brisk walking each day, drink 6-8 glasses of water and eat lots of fruits and vegetables.   Next appointment: Follow up in one year for your annual wellness visit    Preventive Care 65 Years and Older, Female Preventive care refers to lifestyle choices and visits with your health care provider that can promote health and wellness. What does preventive care include? A yearly physical exam. This is also called an annual well check. Dental exams once or twice a year. Routine eye exams. Ask your health care provider how often you should have your eyes checked. Personal lifestyle choices, including: Daily care of your teeth and gums. Regular physical activity. Eating a healthy diet. Avoiding tobacco and drug use. Limiting alcohol use. Practicing safe sex. Taking low-dose  aspirin every day. Taking vitamin and mineral supplements as recommended by your health care provider. What happens during an annual well check? The services and screenings done by your health care provider during your annual well check will depend on your age, overall health, lifestyle risk factors, and family history of disease. Counseling  Your health care provider may ask you questions about your: Alcohol use. Tobacco use. Drug use. Emotional well-being. Home and relationship well-being. Sexual activity. Eating habits. History of falls. Memory and ability to understand (cognition). Work and work Astronomer. Reproductive health. Screening  You may have the following tests or measurements: Height, weight, and BMI. Blood pressure. Lipid and cholesterol levels. These may be checked every 5 years, or more frequently if you are over 12 years old. Skin check. Lung cancer screening. You may have this screening every year starting at age 38 if you have a 30-pack-year history of smoking and currently smoke or have quit within the past 15 years. Fecal occult blood test (FOBT) of the stool. You may have this test every year starting at age 58. Flexible sigmoidoscopy or colonoscopy. You may have a sigmoidoscopy every 5 years or a colonoscopy every 10 years starting at age 55. Hepatitis C blood test. Hepatitis B blood test. Sexually transmitted disease (STD) testing. Diabetes screening. This is done by checking your blood sugar (glucose) after you have not eaten for a while (fasting). You may have this done every 1-3 years. Bone density scan. This is done to screen for osteoporosis. You may have this done starting at age 48. Mammogram. This may be done every 1-2 years. Talk to your health care provider about how often you should have regular mammograms.  Talk with your health care provider about your test results, treatment options, and if necessary, the need for more tests. Vaccines  Your  health care provider may recommend certain vaccines, such as: Influenza vaccine. This is recommended every year. Tetanus, diphtheria, and acellular pertussis (Tdap, Td) vaccine. You may need a Td booster every 10 years. Zoster vaccine. You may need this after age 87. Pneumococcal 13-valent conjugate (PCV13) vaccine. One dose is recommended after age 42. Pneumococcal polysaccharide (PPSV23) vaccine. One dose is recommended after age 70. Talk to your health care provider about which screenings and vaccines you need and how often you need them. This information is not intended to replace advice given to you by your health care provider. Make sure you discuss any questions you have with your health care provider. Document Released: 03/04/2015 Document Revised: 10/26/2015 Document Reviewed: 12/07/2014 Elsevier Interactive Patient Education  2017 [employer] Prevention in the Home Falls can cause injuries. They can happen to people of all ages. There are many things you can do to make your home safe and to help prevent falls. What can I do on the outside of my home? Regularly fix the edges of walkways and driveways and fix any cracks. Remove anything that might make you trip as you walk through a door, such as a raised step or threshold. Trim any bushes or trees on the path to your home. Use bright outdoor lighting. Clear any walking paths of anything that might make someone trip, such as rocks or tools. Regularly check to see if handrails are loose or broken. Make sure that both sides of any steps have handrails. Any raised decks and porches should have guardrails on the edges. Have any leaves, snow, or ice cleared regularly. Use sand or salt on walking paths during winter. Clean up any spills in your garage right away. This includes oil or grease spills. What can I do in the bathroom? Use night lights. Install grab bars by the toilet and in the tub and shower. Do not use towel bars as  grab bars. Use non-skid mats or decals in the tub or shower. If you need to sit down in the shower, use a plastic, non-slip stool. Keep the floor dry. Clean up any water that spills on the floor as soon as it happens. Remove soap buildup in the tub or shower regularly. Attach bath mats securely with double-sided non-slip rug tape. Do not have throw rugs and other things on the floor that can make you trip. What can I do in the bedroom? Use night lights. Make sure that you have a light by your bed that is easy to reach. Do not use any sheets or blankets that are too big for your bed. They should not hang down onto the floor. Have a firm chair that has side arms. You can use this for support while you get dressed. Do not have throw rugs and other things on the floor that can make you trip. What can I do in the kitchen? Clean up any spills right away. Avoid walking on wet floors. Keep items that you use a lot in easy-to-reach places. If you need to reach something above you, use a strong step stool that has a grab bar. Keep electrical cords out of the way. Do not use floor polish or wax that makes floors slippery. If you must use wax, use non-skid floor wax. Do not have throw rugs and other things on the floor that can make  you trip. What can I do with my stairs? Do not leave any items on the stairs. Make sure that there are handrails on both sides of the stairs and use them. Fix handrails that are broken or loose. Make sure that handrails are as long as the stairways. Check any carpeting to make sure that it is firmly attached to the stairs. Fix any carpet that is loose or worn. Avoid having throw rugs at the top or bottom of the stairs. If you do have throw rugs, attach them to the floor with carpet tape. Make sure that you have a light switch at the top of the stairs and the bottom of the stairs. If you do not have them, ask someone to add them for you. What else can I do to help prevent  falls? Wear shoes that: Do not have high heels. Have rubber bottoms. Are comfortable and fit you well. Are closed at the toe. Do not wear sandals. If you use a stepladder: Make sure that it is fully opened. Do not climb a closed stepladder. Make sure that both sides of the stepladder are locked into place. Ask someone to hold it for you, if possible. Clearly mark and make sure that you can see: Any grab bars or handrails. First and last steps. Where the edge of each step is. Use tools that help you move around (mobility aids) if they are needed. These include: Canes. Walkers. Scooters. Crutches. Turn on the lights when you go into a dark area. Replace any light bulbs as soon as they burn out. Set up your furniture so you have a clear path. Avoid moving your furniture around. If any of your floors are uneven, fix them. If there are any pets around you, be aware of where they are. Review your medicines with your doctor. Some medicines can make you feel dizzy. This can increase your chance of falling. Ask your doctor what other things that you can do to help prevent falls. This information is not intended to replace advice given to you by your health care provider. Make sure you discuss any questions you have with your health care provider. Document Released: 12/02/2008 Document Revised: 07/14/2015 Document Reviewed: 03/12/2014 Elsevier Interactive Patient Education  2017 [employer].

## 2021-02-08 NOTE — Progress Notes (Signed)
Subjective:   Theresa Gamble is a 75 y.o. female who presents for Medicare Annual (Subsequent) preventive examination.  Virtual Visit via Telephone Note  I connected with  Theresa Gamble on 02/08/21 at  8:15 AM EST by telephone and verified that I am speaking with the correct person using two identifiers.  Location: Patient: Home Provider: [facility] Persons participating in the virtual visit: patient/Nurse Health Advisor   I discussed the limitations, risks, security and privacy concerns of performing an evaluation and management service by telephone and the availability of in person appointments. The patient expressed understanding and agreed to proceed.  Interactive audio and video telecommunications were attempted between this nurse and patient, however failed, due to patient having technical difficulties OR patient did not have access to video capability.  We continued and completed visit with audio only.  Some vital signs may be absent or patient reported.   Theresa Gamble E Lehew, LPN   Review of Systems     Cardiac Risk Factors include: advanced age (>48men, >20 women);sedentary lifestyle;hypertension     Objective:    Today's Vitals   02/08/21 0817  Weight: 164 lb (74.4 kg)  Height: 5\' 8"  (1.727 m)   Body mass index is 24.94 kg/m.  Advanced Directives 02/08/2021 02/08/2020 01/07/2020 02/04/2019  Does Patient Have a Medical Advance Directive? No No No No  Would patient like information on creating a medical advance directive? No - Patient declined No - Patient declined - No - Patient declined    Current Medications (verified) Outpatient Encounter Medications as of 02/08/2021  Medication Sig   aspirin EC 81 MG tablet Take 81 mg by mouth daily.   calcium carbonate (OSCAL) 1500 (600 Ca) MG TABS tablet Take 600 mg of elemental calcium by mouth daily with breakfast.   diclofenac Sodium (VOLTAREN) 1 % GEL Apply 2 g topically 4 (four) times daily. Give 90 day supply    hydrochlorothiazide (HYDRODIURIL) 12.5 MG tablet Take 1 tablet (12.5 mg total) by mouth daily.   levothyroxine (SYNTHROID) 50 MCG tablet Take 1 tablet (50 mcg total) by mouth daily.   Multiple Vitamin (MULTIVITAMIN) tablet Take 1 tablet by mouth daily.   No facility-administered encounter medications on file as of 02/08/2021.    Allergies (verified) Latex   History: Past Medical History:  Diagnosis Date   Allergy    seasonal allergies   Arthritis    back   Cataract    bilateral sx   Hypertension    Past Surgical History:  Procedure Laterality Date   ABDOMINAL HYSTERECTOMY     BREAST BIOPSY     CHOLECYSTECTOMY     EYE SURGERY Bilateral    cataract removal   Family History  Problem Relation Age of Onset   Heart disease Mother    Diabetes Mother    Stroke Father    Colon cancer Neg Hx    Colon polyps Neg Hx    Esophageal cancer Neg Hx    Rectal cancer Neg Hx    Stomach cancer Neg Hx    Breast cancer Neg Hx    Social History   Socioeconomic History   Marital status: Married    Spouse name: 02/10/2021   Number of children: 2   Years of education: 12   Highest education level: High school graduate  Occupational History   Occupation: retired    Sharon Seller: [employer]    Comment: 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  Tobacco Use   Smoking status: Never   Smokeless tobacco: Never  Vaping Use   Vaping Use: Never used  Substance and Sexual Activity   Alcohol use: Never   Drug use: Never   Sexual activity: Yes    Birth control/protection: Surgical  Other Topics Concern   Not on file  Social History Narrative   Not on file   Social Determinants of Health   Financial Resource Strain: Low Risk    Difficulty of Paying Living Expenses: Not hard at all  Food Insecurity: No Food Insecurity   Worried About Programme researcher, broadcasting/film/video in the Last Year: Never true   Ran Out of Food in the Last Year: Never true  Transportation Needs: No Transportation Needs   Lack of Transportation (Medical):  No   Lack of Transportation (Non-Medical): No  Physical Activity: Insufficiently Active   Days of Exercise per Week: 4 days   Minutes of Exercise per Session: 30 min  Stress: No Stress Concern Present   Feeling of Stress : Not at all  Social Connections: Moderately Integrated   Frequency of Communication with Friends and Family: More than three times a week   Frequency of Social Gatherings with Friends and Family: More than three times a week   Attends Religious Services: 1 to 4 times per year   Active Member of [employer] or Organizations: No   Attends Banker Meetings: Never   Marital Status: Married    Tobacco Counseling Counseling given: Not Answered   Clinical Intake:                 Diabetic? no         Activities of Daily Living In your present state of health, do you have any difficulty performing the following activities: 02/08/2021  Hearing? N  Vision? N  Difficulty concentrating or making decisions? N  Walking or climbing stairs? N  Dressing or bathing? N  Doing errands, shopping? N  Preparing Food and eating ? N  Using the Toilet? N  In the past six months, have you accidently leaked urine? N  Do you have problems with loss of bowel control? N  Managing your Medications? N  Managing your Finances? N  Housekeeping or managing your Housekeeping? N  Some recent data might be hidden    Patient Care Team: Theresa, Elige Radon, MD as PCP - General (Family Medicine) Sheran Luz, MD as Consulting Physician (Physical Medicine and Rehabilitation)  Indicate any recent Medical Services you may have received from other than [facility] providers in the past year (date may be approximate).     Assessment:   This is a routine wellness examination for Theresa Gamble.  Hearing/Vision screen Hearing Screening - Comments:: Moderate hearing difficulties - wears hearing aids at times Vision Screening - Comments:: Wears reading glasses prn only - Up to date with  annual eye exams with [facility]  Dietary issues and exercise activities discussed: Current Exercise Habits: Home exercise routine, Type of exercise: walking;calisthenics, Time (Minutes): 30, Frequency (Times/Week): 4, Weekly Exercise (Minutes/Week): 120, Intensity: Mild, Exercise limited by: orthopedic condition(s)   Goals Addressed             This Visit's Progress    DIET - INCREASE WATER INTAKE   On track    Try to drink 6-8 glasses of water daily     Patient Stated   On track    Continue PT on lower back and see what next steps are for getting pain free. -01/2020 Now getting injections with Dr Ethelene Hal. - 01/2021  Depression Screen PHQ 2/9 Scores 02/08/2021 01/06/2021 08/05/2020 07/11/2020 07/04/2020 05/03/2020 02/25/2020  PHQ - 2 Score 0 0 0 0 0 0 0  PHQ- 9 Score - - - - - - -    Fall Risk Fall Risk  02/08/2021 01/06/2021 08/05/2020 07/04/2020 05/03/2020  Falls in the past year? 0 0 0 0 0  Number falls in past yr: 0 - - - -  Injury with Fall? 0 - - - -  Risk for fall due to : Orthopedic patient - - - -  Follow up Falls prevention discussed - - - -    FALL RISK PREVENTION PERTAINING TO THE HOME:  Any stairs in or around the home? Yes  If so, are there any without handrails? No  Home free of loose throw rugs in walkways, pet beds, electrical cords, etc? Yes  Adequate lighting in your home to reduce risk of falls? Yes   ASSISTIVE DEVICES UTILIZED TO PREVENT FALLS:  Life alert? No  Use of a cane, walker or w/c? No  Grab bars in the bathroom? No  Shower chair or bench in shower? Yes  Elevated toilet seat or a handicapped toilet? Yes   TIMED UP AND GO:  Was the test performed? No . Telephonic visit  Cognitive Function: Normal cognitive status assessed by direct observation by this Nurse Health Advisor. No abnormalities found.       6CIT Screen 02/08/2020 02/04/2019  What Year? 0 points 0 points  What month? 0 points 0 points  What time? 0 points 0 points   Count back from 20 0 points 0 points  Months in reverse 0 points 0 points  Repeat phrase 0 points 0 points  Total Score 0 0    Immunizations Immunization History  Administered Date(s) Administered   Influenza, High Dose Seasonal PF 12/06/2014, 11/30/2015, 11/19/2017, 10/28/2018   Influenza-Unspecified 12/01/2013, 11/20/2016, 11/22/2019, 12/06/2020   PFIZER(Purple Top)SARS-COV-2 Vaccination 03/08/2019, 03/26/2019, 11/05/2019, 06/30/2020   Pfizer Covid-19 Vaccine Bivalent Booster 54yrs & up 01/19/2021   Pneumococcal Conjugate-13 02/17/2015, 12/18/2016   Pneumococcal Polysaccharide-23 12/01/2013, 10/28/2018   Tdap 12/18/2016   Zoster Recombinat (Shingrix) 06/11/2017, 08/13/2017   Zoster, Live 12/05/2013    TDAP status: Up to date  Flu Vaccine status: Up to date  Pneumococcal vaccine status: Up to date  Covid-19 vaccine status: Completed vaccines  Qualifies for Shingles Vaccine? Yes   Zostavax completed Yes   Shingrix Completed?: Yes  Screening Tests Health Maintenance  Topic Date Due   MAMMOGRAM  01/21/2023   TETANUS/TDAP  12/19/2026   COLONOSCOPY (Pts 45-58yrs Insurance coverage will need to be confirmed)  03/24/2030   Pneumonia Vaccine 36+ Years old  Completed   INFLUENZA VACCINE  Completed   DEXA SCAN  Completed   COVID-19 Vaccine  Completed   Zoster Vaccines- Shingrix  Completed   HPV VACCINES  Aged Out   Hepatitis C Screening  Discontinued    Health Maintenance  There are no preventive care reminders to display for this patient.   Colorectal cancer screening: No longer required.   Mammogram status: Completed 01/20/21. Repeat every year  Bone Density status: Completed 01/04/20. Results reflect: Bone density results: OSTEOPENIA. Repeat every 2 years.  Lung Cancer Screening: (Low Dose CT Chest recommended if Age 59-80 years, 30 pack-year currently smoking OR have quit w/in 15years.) does not qualify.   Additional Screening:  Hepatitis C Screening: does  qualify; she declines  Vision Screening: Recommended annual ophthalmology exams for early detection of glaucoma and other disorders  of the eye. Is the patient up to date with their annual eye exam?  No  Who is the provider or what is the name of the office in which the patient attends annual eye exams? [facility] If pt is not established with a provider, would they like to be referred to a provider to establish care? No .   Dental Screening: Recommended annual dental exams for proper oral hygiene  Community Resource Referral / Chronic Care Management: CRR required this visit?  No   CCM required this visit?  No      Plan:     I have personally reviewed and noted the following in the patients chart:   Medical and social history Use of alcohol, tobacco or illicit drugs  Current medications and supplements including opioid prescriptions.  Functional ability and status Nutritional status Physical activity Advanced directives List of other physicians Hospitalizations, surgeries, and ER visits in previous 12 months Vitals Screenings to include cognitive, depression, and falls Referrals and appointments  In addition, I have reviewed and discussed with patient certain preventive protocols, quality metrics, and best practice recommendations. A written personalized care plan for preventive services as well as general preventive health recommendations were provided to patient.     FANTASHIA SHUPERT, LPN   [id number]   Nurse Notes: None

## 2021-03-10 ENCOUNTER — Other Ambulatory Visit: Payer: Medicare HMO

## 2021-03-10 DIAGNOSIS — E041 Nontoxic single thyroid nodule: Secondary | ICD-10-CM

## 2021-03-11 LAB — TSH: TSH: 1.68 u[IU]/mL (ref 0.450–4.500)

## 2021-04-03 DIAGNOSIS — M47816 Spondylosis without myelopathy or radiculopathy, lumbar region: Secondary | ICD-10-CM | POA: Diagnosis not present

## 2021-04-03 DIAGNOSIS — M5136 Other intervertebral disc degeneration, lumbar region: Secondary | ICD-10-CM | POA: Diagnosis not present

## 2021-04-11 ENCOUNTER — Encounter: Payer: Self-pay | Admitting: Nurse Practitioner

## 2021-04-11 ENCOUNTER — Ambulatory Visit (INDEPENDENT_AMBULATORY_CARE_PROVIDER_SITE_OTHER): Payer: Medicare HMO | Admitting: Nurse Practitioner

## 2021-04-11 VITALS — BP 131/78 | HR 93 | Temp 97.3°F | Ht 68.0 in | Wt 160.0 lb

## 2021-04-11 DIAGNOSIS — R052 Subacute cough: Secondary | ICD-10-CM | POA: Diagnosis not present

## 2021-04-11 MED ORDER — GUAIFENESIN ER 600 MG PO TB12: 600.0000 mg | ORAL_TABLET | Freq: Two times a day (BID) | ORAL | 0 refills | Status: DC

## 2021-04-11 MED ORDER — BENZONATATE 100 MG PO CAPS: 100.0000 mg | ORAL_CAPSULE | Freq: Three times a day (TID) | ORAL | 0 refills | Status: DC | PRN

## 2021-04-11 NOTE — Patient Instructions (Signed)

## 2021-04-11 NOTE — Progress Notes (Signed)
Acute Office Visit  Subjective:    Patient ID: Theresa Gamble, female    DOB: 1946/02/15, 76 y.o.   MRN: [id number]  Chief Complaint  Patient presents with   Cough    Per pt has cough since Christmas. Was hoping it goes away however it has not. Its getting worse and can't seem to get rid of it.     Cough This is a chronic problem. The current episode started more than 1 month ago. The problem has been unchanged. The problem occurs constantly. The cough is Productive of sputum. Pertinent negatives include no chest pain, chills, ear congestion, ear pain, headaches, nasal congestion, sore throat or shortness of breath. Nothing aggravates the symptoms. She has tried OTC cough suppressant for the symptoms. The treatment provided no relief. seasonal allergies   Past Medical History:  Diagnosis Date   Allergy    seasonal allergies   Arthritis    back   Cataract    bilateral sx   Hypertension     Past Surgical History:  Procedure Laterality Date   ABDOMINAL HYSTERECTOMY     BREAST BIOPSY     CHOLECYSTECTOMY     EYE SURGERY Bilateral    cataract removal    Family History  Problem Relation Age of Onset   Heart disease Mother    Diabetes Mother    Stroke Father    Colon cancer Neg Hx    Colon polyps Neg Hx    Esophageal cancer Neg Hx    Rectal cancer Neg Hx    Stomach cancer Neg Hx    Breast cancer Neg Hx     Social History   Socioeconomic History   Marital status: Married    Spouse name: Earnie Larsson   Number of children: 2   Years of education: 12   Highest education level: High school graduate  Occupational History   Occupation: retired    Fish farm manager: [employer]    Comment: 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  Tobacco Use   Smoking status: Never   Smokeless tobacco: Never  Vaping Use   Vaping Use: Never used  Substance and Sexual Activity   Alcohol use: Never   Drug use: Never   Sexual activity: Yes    Birth control/protection: Surgical  Other Topics Concern   Not on file   Social History Narrative   Not on file   Social Determinants of Health   Financial Resource Strain: Low Risk    Difficulty of Paying Living Expenses: Not hard at all  Food Insecurity: No Food Insecurity   Worried About Charity fundraiser in the Last Year: Never true   [place] in the Last Year: Never true  Transportation Needs: No Transportation Needs   Lack of Transportation (Medical): No   Lack of Transportation (Non-Medical): No  Physical Activity: Insufficiently Active   Days of Exercise per Week: 4 days   Minutes of Exercise per Session: 30 min  Stress: No Stress Concern Present   Feeling of Stress : Not at all  Social Connections: Moderately Integrated   Frequency of Communication with Friends and Family: More than three times a week   Frequency of Social Gatherings with Friends and Family: More than three times a week   Attends Religious Services: 1 to 4 times per year   Active Member of [employer] or Organizations: No   Attends Archivist Meetings: Never   Marital Status: Married  Human resources officer Violence: Not At Risk  Fear of Current or Ex-Partner: No   Emotionally Abused: No   Physically Abused: No   Sexually Abused: No    Outpatient Medications Prior to Visit  Medication Sig Dispense Refill   aspirin EC 81 MG tablet Take 81 mg by mouth daily.     calcium carbonate (OSCAL) 1500 (600 Ca) MG TABS tablet Take 600 mg of elemental calcium by mouth daily with breakfast.     diclofenac Sodium (VOLTAREN) 1 % GEL Apply 2 g topically 4 (four) times daily. Give 90 day supply 750 g 3   hydrochlorothiazide (HYDRODIURIL) 12.5 MG tablet Take 1 tablet (12.5 mg total) by mouth daily. 90 tablet 3   levothyroxine (SYNTHROID) 50 MCG tablet Take 1 tablet (50 mcg total) by mouth daily. 90 tablet 1   Multiple Vitamin (MULTIVITAMIN) tablet Take 1 tablet by mouth daily.     No facility-administered medications prior to visit.    Allergies  Allergen Reactions   Latex  Rash    Review of Systems  Constitutional:  Negative for chills.  HENT:  Negative for ear pain and sore throat.   Respiratory:  Positive for cough. Negative for shortness of breath.   Cardiovascular:  Negative for chest pain.  Neurological:  Negative for headaches.  All other systems reviewed and are negative.     Objective:    Physical Exam Vitals and nursing note reviewed.  Constitutional:      Appearance: Normal appearance.  HENT:     Head: Normocephalic.     Right Ear: External ear normal.     Left Ear: External ear normal.     Nose: Nose normal.     Mouth/Throat:     Mouth: Mucous membranes are moist.     Pharynx: Oropharynx is clear.  Eyes:     Conjunctiva/sclera: Conjunctivae normal.  Cardiovascular:     Rate and Rhythm: Normal rate and regular rhythm.     Pulses: Normal pulses.     Heart sounds: Normal heart sounds.  Pulmonary:     Effort: Pulmonary effort is normal.     Breath sounds: Normal breath sounds.  Abdominal:     General: Bowel sounds are normal.  Musculoskeletal:        General: Normal range of motion.  Skin:    General: Skin is warm.     Findings: No rash.  Neurological:     Mental Status: She is alert and oriented to person, place, and time.  Psychiatric:        Behavior: Behavior normal.    BP 131/78    Pulse 93    Temp (!) 97.3 F (36.3 C) (Temporal)    Ht $R'5\' 8"'eD$  (1.727 m)    Wt 160 lb (72.6 kg)    SpO2 95%    BMI 24.33 kg/m  Wt Readings from Last 3 Encounters:  04/11/21 160 lb (72.6 kg)  02/08/21 164 lb (74.4 kg)  01/06/21 164 lb (74.4 kg)      Lab Results  Component Value Date   TSH 1.680 03/10/2021   Lab Results  Component Value Date   WBC 4.0 01/06/2021   HGB 13.3 01/06/2021   HCT 40.1 01/06/2021   MCV 91 01/06/2021   PLT 287 01/06/2021   Lab Results  Component Value Date   NA 140 01/06/2021   K 4.2 01/06/2021   CO2 25 01/06/2021   GLUCOSE 102 (H) 01/06/2021   BUN 19 01/06/2021   CREATININE 0.65 01/06/2021    BILITOT 0.4 01/06/2021  ALKPHOS 63 01/06/2021   AST 17 01/06/2021   ALT 10 01/06/2021   PROT 7.1 01/06/2021   ALBUMIN 4.1 01/06/2021   CALCIUM 10.4 (H) 01/06/2021   ANIONGAP 7 07/05/2017   EGFR 93 01/06/2021   Lab Results  Component Value Date   CHOL 182 01/06/2021   Lab Results  Component Value Date   HDL 70 01/06/2021   Lab Results  Component Value Date   LDLCALC 97 01/06/2021   Lab Results  Component Value Date   TRIG 81 01/06/2021   Lab Results  Component Value Date   CHOLHDL 2.6 01/06/2021   Lab Results  Component Value Date   HGBA1C 5.8 (H) 01/06/2021       Assessment & Plan:  Take meds as prescribed - Use a cool mist humidifier  -Use saline nose sprays frequently -Force fluids -For fever or aches or pains- take Tylenol or ibuprofen. -Muccinex for cough -Benzonate for cough  -If symptoms do not improve, she may need to be COVID tested to rule this out Follow up with worsening unresolved symptoms  Problem List Items Addressed This Visit   None Visit Diagnoses     Subacute cough    -  Primary   Relevant Medications   benzonatate (TESSALON PERLES) 100 MG capsule   guaiFENesin (MUCINEX) 600 MG 12 hr tablet        Meds ordered this encounter  Medications   benzonatate (TESSALON PERLES) 100 MG capsule    Sig: Take 1 capsule (100 mg total) by mouth 3 (three) times daily as needed.    Dispense:  20 capsule    Refill:  0    Order Specific Question:   Supervising Provider    Answer:   Claretta Fraise [[id number]]   guaiFENesin (MUCINEX) 600 MG 12 hr tablet    Sig: Take 1 tablet (600 mg total) by mouth 2 (two) times daily.    Dispense:  30 tablet    Refill:  0    Order Specific Question:   Supervising Provider    Answer:   Claretta Fraise [[id number]]     Ivy Lynn, NP

## 2021-04-13 ENCOUNTER — Telehealth: Payer: Self-pay | Admitting: Family Medicine

## 2021-04-13 DIAGNOSIS — M47816 Spondylosis without myelopathy or radiculopathy, lumbar region: Secondary | ICD-10-CM | POA: Diagnosis not present

## 2021-04-14 ENCOUNTER — Telehealth: Payer: Self-pay | Admitting: *Deleted

## 2021-04-14 ENCOUNTER — Other Ambulatory Visit: Payer: Self-pay | Admitting: Nurse Practitioner

## 2021-04-14 DIAGNOSIS — R052 Subacute cough: Secondary | ICD-10-CM

## 2021-04-14 MED ORDER — BENZONATATE 200 MG PO CAPS: 200.0000 mg | ORAL_CAPSULE | Freq: Three times a day (TID) | ORAL | 0 refills | Status: DC | PRN

## 2021-04-14 NOTE — Telephone Encounter (Signed)
error 

## 2021-04-16 ENCOUNTER — Encounter: Payer: Self-pay | Admitting: Emergency Medicine

## 2021-04-16 ENCOUNTER — Telehealth: Payer: Medicare HMO | Admitting: Emergency Medicine

## 2021-04-16 DIAGNOSIS — R051 Acute cough: Secondary | ICD-10-CM

## 2021-04-16 DIAGNOSIS — R531 Weakness: Secondary | ICD-10-CM

## 2021-04-16 NOTE — Progress Notes (Signed)
Based on what you shared with me, I feel your condition warrants further evaluation and I recommend that you be seen in a face to face visit.  I am concerned you may have bronchitis or pneumonia, please seek in person evaluation at urgent care or the emergency room for further evaluation and management.     NOTE: There will be NO CHARGE for this eVisit   If you are having a true medical emergency please call 911.      For an urgent face to face visit, [facility] has six urgent care centers for your convenience:     [facility] [place] Get Driving Directions [id number] [address] [place], [place] [address]    [facility]) Get Driving Directions [id number] [place], [place] [address]  [facility] ([place]) Get Driving Directions [id number] [address]  [place],  [place]  [address]  [facility]  Get Driving Directions [id number] [place] [address], [place], [place] [address]   [facility] [place] Get Driving Directions  [id number] [address] [place], [place] [address]   [facility] [place] Get Driving Directions [id number] [address] [place],  [address]  Your MyChart E-visit questionnaire answers were reviewed by a board certified advanced clinical practitioner to complete your personal care plan based on your specific symptoms.  Thank you for using e-Visits.

## 2021-04-17 ENCOUNTER — Other Ambulatory Visit: Payer: Self-pay | Admitting: Nurse Practitioner

## 2021-04-17 DIAGNOSIS — R052 Subacute cough: Secondary | ICD-10-CM

## 2021-04-17 MED ORDER — AMOXICILLIN-POT CLAVULANATE 875-125 MG PO TABS: 1.0000 | ORAL_TABLET | Freq: Two times a day (BID) | ORAL | 0 refills | Status: DC

## 2021-04-17 NOTE — Telephone Encounter (Signed)
Pt states she picked up the 200mg  and has been taking it this weekend but it hasn't been working and she still has terrible cough.

## 2021-04-18 NOTE — Telephone Encounter (Signed)
Pt has the ATB. Cough is improving. States that she does feel a little weak but better. Advised to increase fluid intake. She will call back if needed.

## 2021-04-20 IMAGING — MR MR CERVICAL SPINE W/O CM
5 series · 38 of 48 positions shown · non-contrast
Comparison: Cervical radiography 01/04/2020

CLINICAL DATA: Chronic neck pain with degenerative changes by x-ray

EXAM:
MRI CERVICAL SPINE WITHOUT CONTRAST
TECHNIQUE: Multiplanar, multisequence MR imaging of the cervical spine was
performed. No intravenous contrast was administered.

[Series 16: T2 · sagittal · 3.0mm · 0.69mm/px · 6 of 15 slices shown (1 of 2)]
[im 1/15]
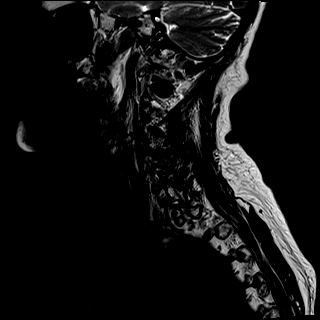
[im 3/15]
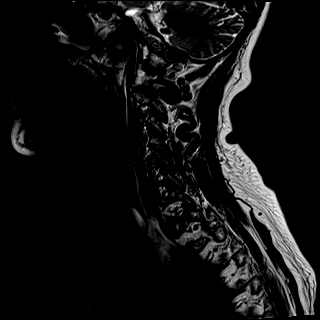
[im 6/15]
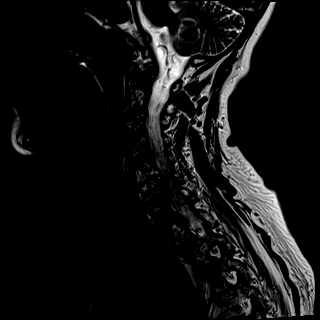
[im 9/15]
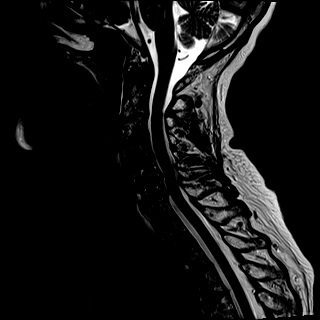
[im 12/15]
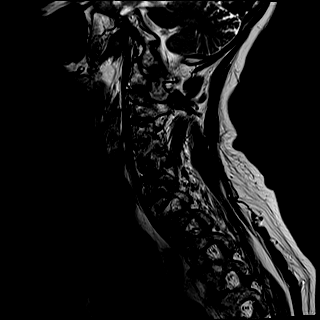
[im 15/15]
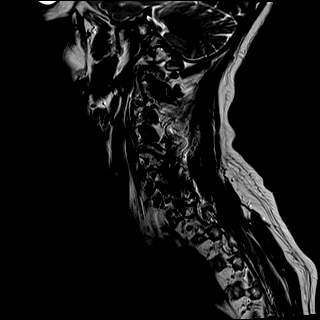

[Series 17: T1 · sagittal · 3.0mm · 0.69mm/px · 7 of 15 slices shown]
[im 1/15]
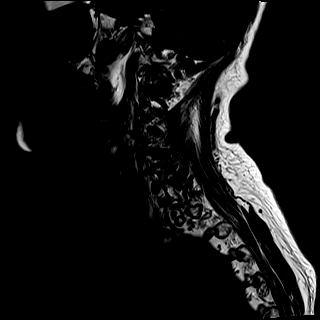
[im 3/15]
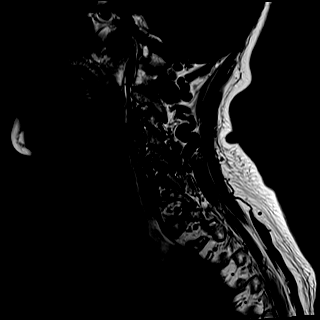
[im 5/15]
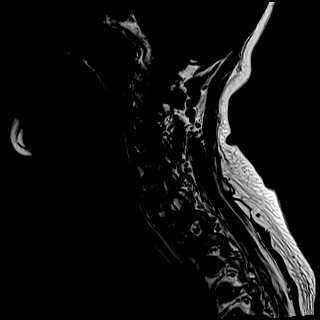
[im 8/15]
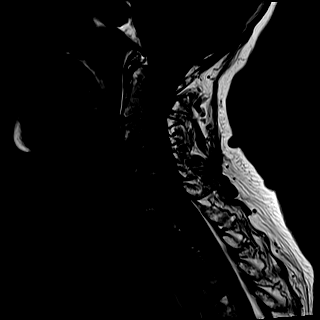
[im 10/15]
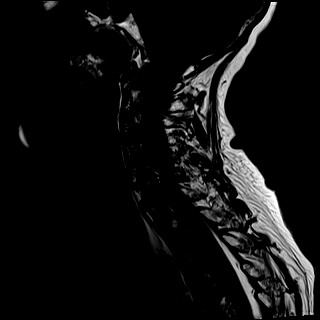
[im 12/15]
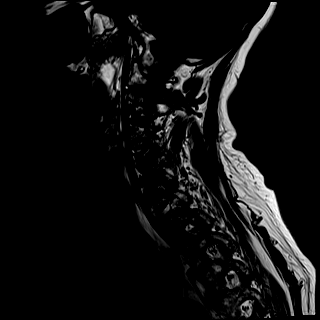
[im 15/15]
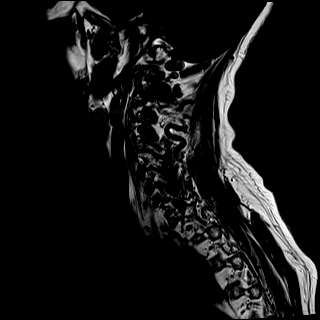

[Series 18: STIR · sagittal · 3.0mm · 0.86mm/px · 7 of 15 slices shown]
[im 1/15]
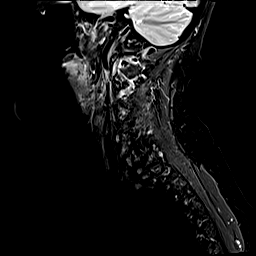
[im 3/15]
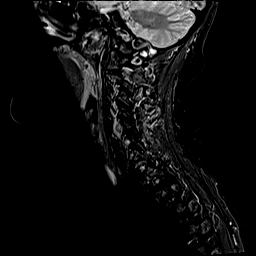
[im 5/15]
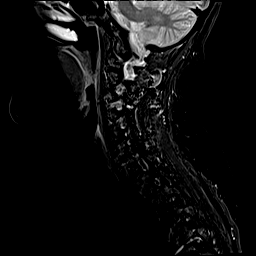
[im 8/15]
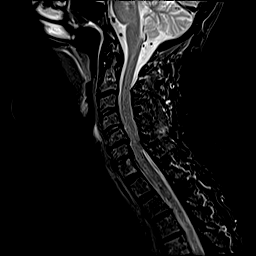
[im 10/15]
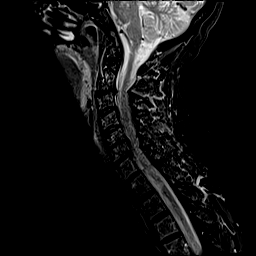
[im 12/15]
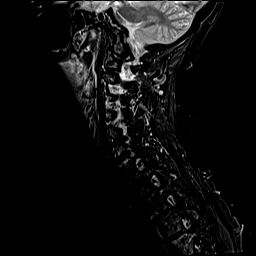
[im 15/15]
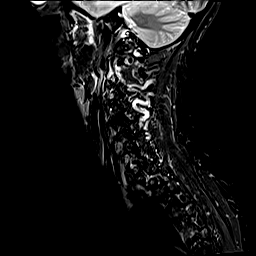

[Series 19: T2 · axial · 3.0mm · 0.66mm/px · z∈[-58,+29]mm · 10 of 30 slices shown (2 of 2)]
[im 1/30]
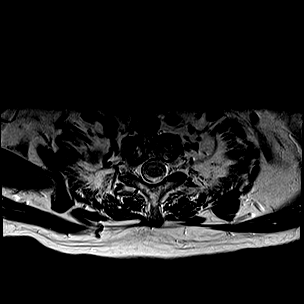
[im 3/30]
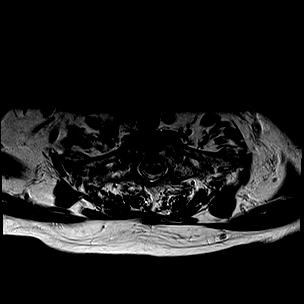
[im 5/30]
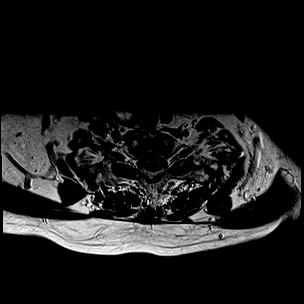
[im 7/30]
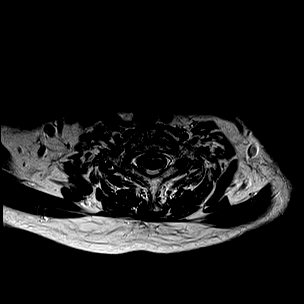
[im 9/30]
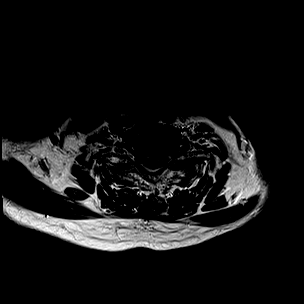
[im 14/30]
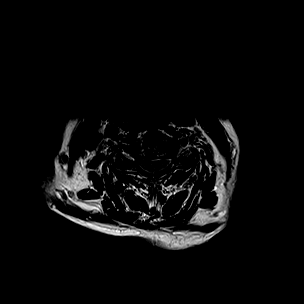
[im 16/30]
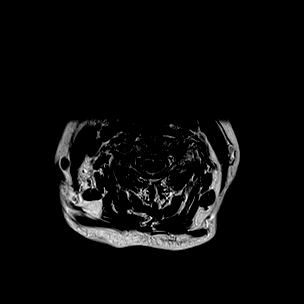
[im 21/30]
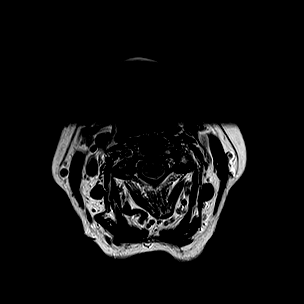
[im 25/30]
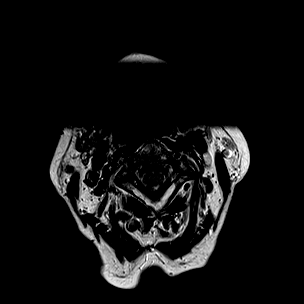
[im 30/30]
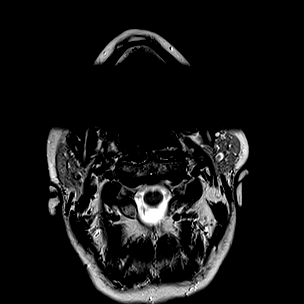

[Series 20: GRE · axial · 3.0mm · 0.35mm/px · z∈[-54,+34]mm · 8 of 30 slices shown]
[im 1/30]
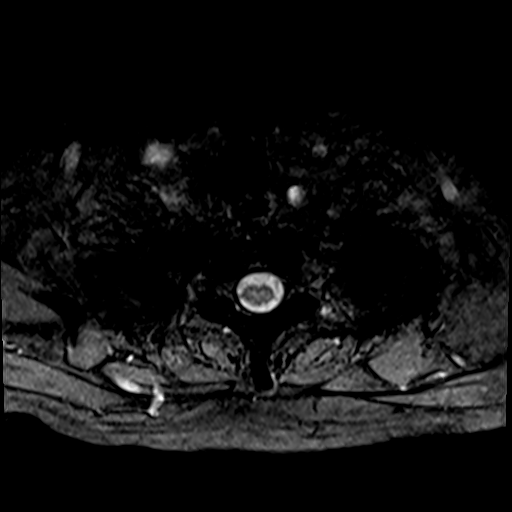
[im 5/30]
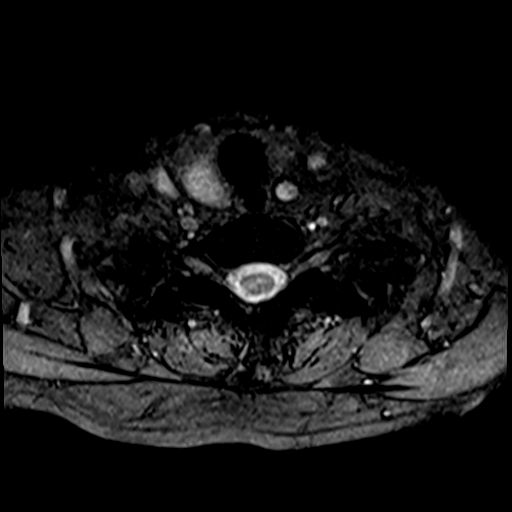
[im 9/30]
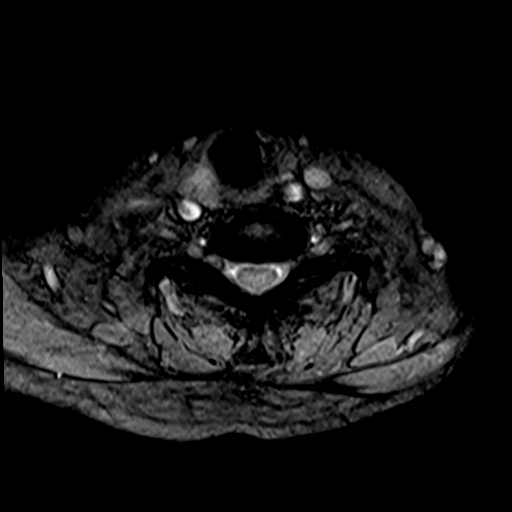
[im 14/30]
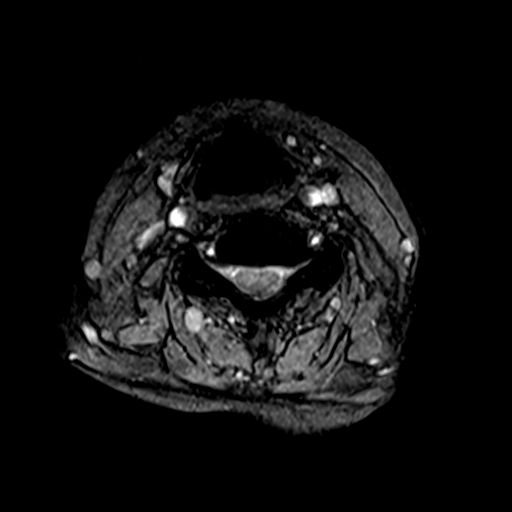
[im 16/30]
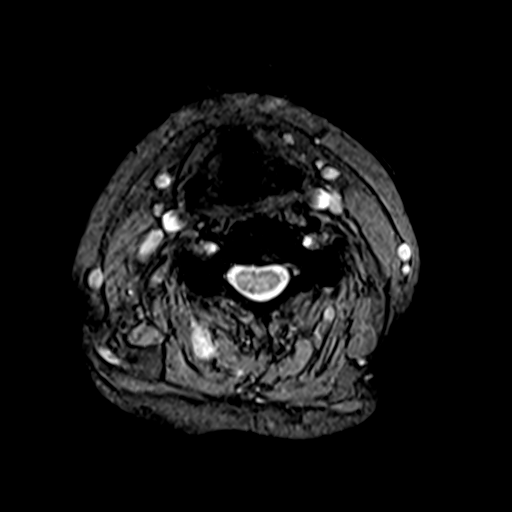
[im 21/30]
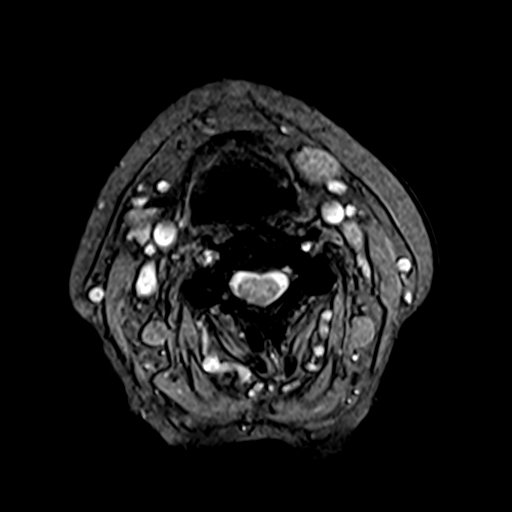
[im 25/30]
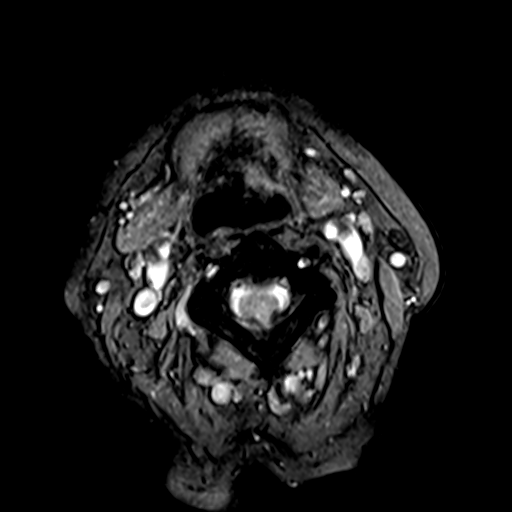
[im 30/30]
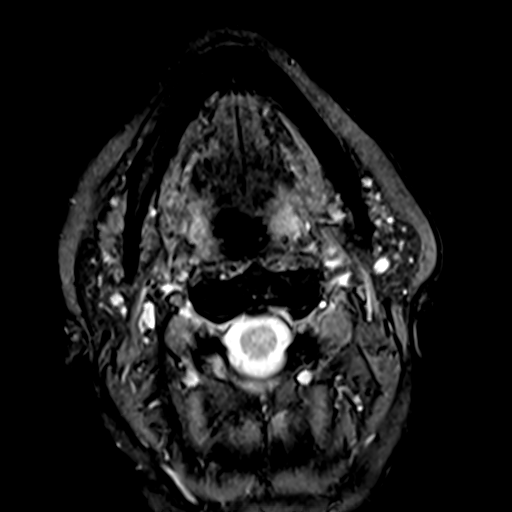

[38 of 48 positions shown; findings below may reference images not displayed]

FINDINGS: Alignment: Slight retrolisthesis at C5-6.

Vertebrae: No fracture, evidence of discitis, or bone lesion.

Cord: Normal signal and morphology.

Posterior Fossa, vertebral arteries, paraspinal tissues: 14 mm right
thyroid nodule, size stable and cystic appearing on a 2619 chest CT.
No followup recommended (ref: [HOSPITAL]. [DATE]):

Disc levels:

C2-3: Disc narrowing with minor ridging.

C3-4: Disc narrowing with tiny central.  No neural impingement

C4-5: Facet osteoarthritis with spurring on the left more than
right. Tiny central disc protrusion. No neural compression.

C5-6: Disc narrowing with endplate and uncovertebral ridging
asymmetric to the left where there is prominent foraminal stenosis.
Mild spinal stenosis and ventral cord flattening.

C6-7: Disc narrowing and bulging with uncovertebral ridging
asymmetric to the left where there is moderate foraminal stenosis.
Ligamentum flavum thickening. Mild spinal stenosis.

C7-T1:Unremarkable.
IMPRESSION: Spinal degeneration most notable at C5-6 and C6-7 where there is
left foraminal impingement and mild spinal stenosis that is greater
at C5-6.

## 2021-05-20 DIAGNOSIS — M5136 Other intervertebral disc degeneration, lumbar region: Secondary | ICD-10-CM | POA: Diagnosis not present

## 2021-05-29 DIAGNOSIS — M5451 Vertebrogenic low back pain: Secondary | ICD-10-CM | POA: Diagnosis not present

## 2021-06-22 ENCOUNTER — Other Ambulatory Visit: Payer: Self-pay | Admitting: Family Medicine

## 2021-06-29 ENCOUNTER — Other Ambulatory Visit: Payer: Medicare HMO

## 2021-06-29 DIAGNOSIS — I10 Essential (primary) hypertension: Secondary | ICD-10-CM | POA: Diagnosis not present

## 2021-06-29 DIAGNOSIS — E041 Nontoxic single thyroid nodule: Secondary | ICD-10-CM | POA: Diagnosis not present

## 2021-06-29 DIAGNOSIS — R7303 Prediabetes: Secondary | ICD-10-CM | POA: Diagnosis not present

## 2021-06-29 LAB — BAYER DCA HB A1C WAIVED: HB A1C (BAYER DCA - WAIVED): 5.7 % — ABNORMAL HIGH (ref 4.8–5.6)

## 2021-06-30 LAB — CMP14+EGFR
ALT: 13 IU/L (ref 0–32)
AST: 18 IU/L (ref 0–40)
Albumin/Globulin Ratio: 1.3 (ref 1.2–2.2)
Albumin: 3.8 g/dL (ref 3.7–4.7)
Alkaline Phosphatase: 65 IU/L (ref 44–121)
BUN/Creatinine Ratio: 21 (ref 12–28)
BUN: 14 mg/dL (ref 8–27)
Bilirubin Total: 0.4 mg/dL (ref 0.0–1.2)
CO2: 22 mmol/L (ref 20–29)
Calcium: 9.5 mg/dL (ref 8.7–10.3)
Chloride: 106 mmol/L (ref 96–106)
Creatinine, Ser: 0.66 mg/dL (ref 0.57–1.00)
Globulin, Total: 3 g/dL (ref 1.5–4.5)
Glucose: 90 mg/dL (ref 70–99)
Potassium: 3.8 mmol/L (ref 3.5–5.2)
Sodium: 142 mmol/L (ref 134–144)
Total Protein: 6.8 g/dL (ref 6.0–8.5)
eGFR: 92 mL/min/{1.73_m2} (ref >59)

## 2021-06-30 LAB — LIPID PANEL
Chol/HDL Ratio: 2.6 ratio (ref 0.0–4.4)
Cholesterol, Total: 168 mg/dL (ref 100–199)
HDL: 65 mg/dL (ref >39)
LDL Chol Calc (NIH): 86 mg/dL (ref 0–99)
Triglycerides: 96 mg/dL (ref 0–149)
VLDL Cholesterol Cal: 17 mg/dL (ref 5–40)

## 2021-06-30 LAB — CBC WITH DIFFERENTIAL/PLATELET
Basophils Absolute: 0 10*3/uL (ref 0.0–0.2)
Basos: 1 %
EOS (ABSOLUTE): 0.1 10*3/uL (ref 0.0–0.4)
Eos: 4 %
Hematocrit: 39.2 % (ref 34.0–46.6)
Hemoglobin: 12.8 g/dL (ref 11.1–15.9)
Immature Grans (Abs): 0 10*3/uL (ref 0.0–0.1)
Immature Granulocytes: 0 %
Lymphocytes Absolute: 1.8 10*3/uL (ref 0.7–3.1)
Lymphs: 49 %
MCH: 30 pg (ref 26.6–33.0)
MCHC: 32.7 g/dL (ref 31.5–35.7)
MCV: 92 fL (ref 79–97)
Monocytes Absolute: 0.5 10*3/uL (ref 0.1–0.9)
Monocytes: 13 %
Neutrophils Absolute: 1.2 10*3/uL — ABNORMAL LOW (ref 1.4–7.0)
Neutrophils: 33 %
Platelets: 229 10*3/uL (ref 150–450)
RBC: 4.26 x10E6/uL (ref 3.77–5.28)
RDW: 13.6 % (ref 11.7–15.4)
WBC: 3.6 10*3/uL (ref 3.4–10.8)

## 2021-06-30 LAB — TSH: TSH: 1.86 u[IU]/mL (ref 0.450–4.500)

## 2021-07-03 DIAGNOSIS — Z0100 Encounter for examination of eyes and vision without abnormal findings: Secondary | ICD-10-CM | POA: Diagnosis not present

## 2021-07-03 DIAGNOSIS — H52229 Regular astigmatism, unspecified eye: Secondary | ICD-10-CM | POA: Diagnosis not present

## 2021-07-07 ENCOUNTER — Ambulatory Visit (INDEPENDENT_AMBULATORY_CARE_PROVIDER_SITE_OTHER): Payer: Medicare HMO | Admitting: Family Medicine

## 2021-07-07 ENCOUNTER — Encounter: Payer: Self-pay | Admitting: Family Medicine

## 2021-07-07 VITALS — BP 152/75 | HR 88 | Temp 97.3°F | Ht 68.0 in | Wt 161.0 lb

## 2021-07-07 DIAGNOSIS — G8929 Other chronic pain: Secondary | ICD-10-CM | POA: Diagnosis not present

## 2021-07-07 DIAGNOSIS — M545 Low back pain, unspecified: Secondary | ICD-10-CM | POA: Diagnosis not present

## 2021-07-07 DIAGNOSIS — Z0001 Encounter for general adult medical examination with abnormal findings: Secondary | ICD-10-CM | POA: Diagnosis not present

## 2021-07-07 DIAGNOSIS — M542 Cervicalgia: Secondary | ICD-10-CM | POA: Diagnosis not present

## 2021-07-07 DIAGNOSIS — I10 Essential (primary) hypertension: Secondary | ICD-10-CM

## 2021-07-07 DIAGNOSIS — Z0000 Encounter for general adult medical examination without abnormal findings: Secondary | ICD-10-CM

## 2021-07-07 DIAGNOSIS — M47816 Spondylosis without myelopathy or radiculopathy, lumbar region: Secondary | ICD-10-CM

## 2021-07-07 DIAGNOSIS — R7303 Prediabetes: Secondary | ICD-10-CM | POA: Diagnosis not present

## 2021-07-07 MED ORDER — DICLOFENAC SODIUM 1 % EX GEL: 2.0000 g | Freq: Four times a day (QID) | CUTANEOUS | 3 refills | Status: DC

## 2021-07-07 NOTE — Progress Notes (Signed)
BP (!) 152/75   Pulse 88   Temp (!) 97.3 F (36.3 C)   Ht 5' 8" (1.727 m)   Wt 161 lb (73 kg)   SpO2 96%   BMI 24.48 kg/m    Subjective:   Patient ID: Theresa Gamble, female    DOB: [birth year], 76 y.o.   MRN: [id number]  HPI: Theresa Gamble is a 76 y.o. female presenting on 07/07/2021 for Medical Management of Chronic Issues, Hypertension, and Hypothyroidism   HPI Physical exam Patient denies any chest pain, shortness of breath, headaches or vision issues, abdominal complaints, diarrhea, nausea, vomiting, or joint issues.  The only real complaint she has is with her back and she is working with an orthopedic and a pain management specialist and Dr. Nelva Bush to do injections and try other things.  She continues to work with them and they are trying to improve it.  It just has been frustrating that is not getting better as quickly as they would like.  Hypertension Patient is currently on hydrochlorothiazide, and their blood pressure today is 152/75, she says it runs better at home, will keep a close eye on it. Patient denies any lightheadedness or dizziness. Patient denies headaches, blurred vision, chest pains, shortness of breath, or weakness. Denies any side effects from medication and is content with current medication.   Hypothyroidism recheck Patient is coming in for thyroid recheck today as well. They deny any issues with hair changes or heat or cold problems or diarrhea or constipation. They deny any chest pain or palpitations. They are currently on levothyroxine 50 micrograms   Prediabetes  patient comes in today for recheck of his diabetes. Patient has been currently taking no medication currently. Patient is not currently on an ACE inhibitor/ARB. Patient has not seen an ophthalmologist this year. Patient denies any issues with their feet. The symptom started onset as an adult hypertension and hypothyroidism ARE RELATED TO DM   Relevant past medical, surgical, family and social  history reviewed and updated as indicated. Interim medical history since our last visit reviewed. Allergies and medications reviewed and updated.  Review of Systems  Constitutional:  Negative for chills and fever.  HENT:  Negative for ear pain and tinnitus.   Eyes:  Negative for pain.  Respiratory:  Negative for cough, shortness of breath and wheezing.   Cardiovascular:  Negative for chest pain, palpitations and leg swelling.  Gastrointestinal:  Negative for abdominal pain, blood in stool, constipation and diarrhea.  Genitourinary:  Negative for dysuria and hematuria.  Musculoskeletal:  Positive for arthralgias and back pain. Negative for myalgias.  Skin:  Negative for rash.  Neurological:  Negative for dizziness, weakness and headaches.  Psychiatric/Behavioral:  Negative for suicidal ideas.    Per HPI unless specifically indicated above   Allergies as of 07/07/2021       Reactions   Latex Rash        Medication List        Accurate as of Jul 07, 2021 10:18 AM. If you have any questions, ask your nurse or doctor.          STOP taking these medications    amoxicillin-clavulanate 875-125 MG tablet Commonly known as: AUGMENTIN Stopped by: Fransisca Kaufmann Modena Bellemare, MD   benzonatate 200 MG capsule Commonly known as: TESSALON Stopped by: Fransisca Kaufmann Jaidyn Usery, MD   guaiFENesin 600 MG 12 hr tablet Commonly known as: Mucinex Stopped by: Worthy Rancher, MD       TAKE these medications  aspirin EC 81 MG tablet Take 81 mg by mouth daily.   calcium carbonate 1500 (600 Ca) MG Tabs tablet Commonly known as: OSCAL Take 600 mg of elemental calcium by mouth daily with breakfast.   diclofenac Sodium 1 % Gel Commonly known as: Voltaren Apply 2 g topically 4 (four) times daily. Give 90 day supply   hydrochlorothiazide 12.5 MG tablet Commonly known as: HYDRODIURIL Take 1 tablet (12.5 mg total) by mouth daily.   levothyroxine 50 MCG tablet Commonly known as:  SYNTHROID Take 1 tablet by mouth once daily   multivitamin tablet Take 1 tablet by mouth daily.         Objective:   BP (!) 152/75   Pulse 88   Temp (!) 97.3 F (36.3 C)   Ht 5' 8" (1.727 m)   Wt 161 lb (73 kg)   SpO2 96%   BMI 24.48 kg/m   Wt Readings from Last 3 Encounters:  07/07/21 161 lb (73 kg)  04/11/21 160 lb (72.6 kg)  02/08/21 164 lb (74.4 kg)    Physical Exam Vitals and nursing note reviewed.  Constitutional:      General: She is not in acute distress.    Appearance: She is well-developed. She is not diaphoretic.  Eyes:     Conjunctiva/sclera: Conjunctivae normal.     Pupils: Pupils are equal, round, and reactive to light.  Cardiovascular:     Rate and Rhythm: Normal rate and regular rhythm.     Heart sounds: Normal heart sounds. No murmur heard. Pulmonary:     Effort: Pulmonary effort is normal. No respiratory distress.     Breath sounds: Normal breath sounds. No wheezing.  Abdominal:     General: Abdomen is flat. Bowel sounds are normal. There is no distension.     Tenderness: There is no abdominal tenderness. There is no guarding or rebound.  Musculoskeletal:        General: Tenderness (Bilateral lumbar tenderness, especially with range of motion in flexion) present. Normal range of motion.  Skin:    General: Skin is warm and dry.     Findings: No erythema, lesion or rash.  Neurological:     Mental Status: She is alert and oriented to person, place, and time.     Motor: No weakness.     Coordination: Coordination normal.     Gait: Gait normal.  Psychiatric:        Behavior: Behavior normal.    Results for orders placed or performed in visit on 06/29/21  CBC with Differential/Platelet  Result Value Ref Range   WBC 3.6 3.4 - 10.8 x10E3/uL   RBC 4.26 3.77 - 5.28 x10E6/uL   Hemoglobin 12.8 11.1 - 15.9 g/dL   Hematocrit 39.2 34.0 - 46.6 %   MCV 92 79 - 97 fL   MCH 30.0 26.6 - 33.0 pg   MCHC 32.7 31.5 - 35.7 g/dL   RDW 13.6 11.7 - 15.4 %    Platelets 229 150 - 450 x10E3/uL   Neutrophils 33 Not Estab. %   Lymphs 49 Not Estab. %   Monocytes 13 Not Estab. %   Eos 4 Not Estab. %   Basos 1 Not Estab. %   Neutrophils Absolute 1.2 (L) 1.4 - 7.0 x10E3/uL   Lymphocytes Absolute 1.8 0.7 - 3.1 x10E3/uL   Monocytes Absolute 0.5 0.1 - 0.9 x10E3/uL   EOS (ABSOLUTE) 0.1 0.0 - 0.4 x10E3/uL   Basophils Absolute 0.0 0.0 - 0.2 x10E3/uL   Immature Granulocytes  0 Not Estab. %   Immature Grans (Abs) 0.0 0.0 - 0.1 x10E3/uL  CMP14+EGFR  Result Value Ref Range   Glucose 90 70 - 99 mg/dL   BUN 14 8 - 27 mg/dL   Creatinine, Ser 0.66 0.57 - 1.00 mg/dL   eGFR 92 >59 mL/min/1.73   BUN/Creatinine Ratio 21 12 - 28   Sodium 142 134 - 144 mmol/L   Potassium 3.8 3.5 - 5.2 mmol/L   Chloride 106 96 - 106 mmol/L   CO2 22 20 - 29 mmol/L   Calcium 9.5 8.7 - 10.3 mg/dL   Total Protein 6.8 6.0 - 8.5 g/dL   Albumin 3.8 3.7 - 4.7 g/dL   Globulin, Total 3.0 1.5 - 4.5 g/dL   Albumin/Globulin Ratio 1.3 1.2 - 2.2   Bilirubin Total 0.4 0.0 - 1.2 mg/dL   Alkaline Phosphatase 65 44 - 121 IU/L   AST 18 0 - 40 IU/L   ALT 13 0 - 32 IU/L  Lipid panel  Result Value Ref Range   Cholesterol, Total 168 100 - 199 mg/dL   Triglycerides 96 0 - 149 mg/dL   HDL 65 >39 mg/dL   VLDL Cholesterol Cal 17 5 - 40 mg/dL   LDL Chol Calc (NIH) 86 0 - 99 mg/dL   Chol/HDL Ratio 2.6 0.0 - 4.4 ratio  TSH  Result Value Ref Range   TSH 1.860 0.450 - 4.500 uIU/mL  Bayer DCA Hb A1c Waived  Result Value Ref Range   HB A1C (BAYER DCA - WAIVED) 5.7 (H) 4.8 - 5.6 %    Assessment & Plan:   Problem List Items Addressed This Visit       Cardiovascular and Mediastinum   Hypertension     Other   Prediabetes   Other Visit Diagnoses     Physical exam    -  Primary   Chronic bilateral low back pain without sciatica       Relevant Medications   diclofenac Sodium (VOLTAREN) 1 % GEL   Cervicalgia       Relevant Medications   diclofenac Sodium (VOLTAREN) 1 % GEL   Osteoarthritis  of lumbar spine, unspecified spinal osteoarthritis complication status       Relevant Medications   diclofenac Sodium (VOLTAREN) 1 % GEL       Blood work looks good, continue current medicine, no changes.  Continue work with Dr. Herma Mering on lower back pain and arthritis. Follow up plan: Return in about 6 months (around 01/07/2022), or if symptoms worsen or fail to improve, for Hypertension and prediabetes and thyroid recheck.  Counseling provided for all of the vaccine components No orders of the defined types were placed in this encounter.   Caryl Pina, MD [facility] 07/07/2021, 10:18 AM

## 2021-07-24 ENCOUNTER — Other Ambulatory Visit: Payer: Self-pay | Admitting: Physical Medicine and Rehabilitation

## 2021-07-24 ENCOUNTER — Other Ambulatory Visit (HOSPITAL_COMMUNITY): Payer: Self-pay | Admitting: Physical Medicine and Rehabilitation

## 2021-07-24 DIAGNOSIS — M5459 Other low back pain: Secondary | ICD-10-CM

## 2021-07-24 DIAGNOSIS — M47816 Spondylosis without myelopathy or radiculopathy, lumbar region: Secondary | ICD-10-CM | POA: Diagnosis not present

## 2021-07-24 DIAGNOSIS — M5136 Other intervertebral disc degeneration, lumbar region: Secondary | ICD-10-CM | POA: Diagnosis not present

## 2021-08-08 ENCOUNTER — Ambulatory Visit (HOSPITAL_COMMUNITY)
Admission: RE | Admit: 2021-08-08 | Discharge: 2021-08-08 | Disposition: A | Discharge status: Home / Self Care | Payer: Medicare HMO | Source: Ambulatory Visit | Attending: Physical Medicine and Rehabilitation | Admitting: Physical Medicine and Rehabilitation

## 2021-08-08 DIAGNOSIS — M5459 Other low back pain: Secondary | ICD-10-CM | POA: Insufficient documentation

## 2021-08-08 DIAGNOSIS — M47816 Spondylosis without myelopathy or radiculopathy, lumbar region: Secondary | ICD-10-CM | POA: Diagnosis not present

## 2021-08-18 DIAGNOSIS — G8929 Other chronic pain: Secondary | ICD-10-CM | POA: Diagnosis not present

## 2021-08-18 DIAGNOSIS — M545 Low back pain, unspecified: Secondary | ICD-10-CM | POA: Diagnosis not present

## 2021-10-31 DIAGNOSIS — M5136 Other intervertebral disc degeneration, lumbar region: Secondary | ICD-10-CM | POA: Diagnosis not present

## 2021-10-31 DIAGNOSIS — M5416 Radiculopathy, lumbar region: Secondary | ICD-10-CM | POA: Diagnosis not present

## 2021-12-08 DIAGNOSIS — M5137 Other intervertebral disc degeneration, lumbosacral region: Secondary | ICD-10-CM | POA: Diagnosis not present

## 2021-12-08 DIAGNOSIS — M5412 Radiculopathy, cervical region: Secondary | ICD-10-CM | POA: Diagnosis not present

## 2021-12-08 DIAGNOSIS — M50323 Other cervical disc degeneration at C6-C7 level: Secondary | ICD-10-CM | POA: Diagnosis not present

## 2021-12-08 DIAGNOSIS — M5416 Radiculopathy, lumbar region: Secondary | ICD-10-CM | POA: Diagnosis not present

## 2021-12-08 DIAGNOSIS — M8588 Other specified disorders of bone density and structure, other site: Secondary | ICD-10-CM | POA: Diagnosis not present

## 2021-12-08 DIAGNOSIS — M542 Cervicalgia: Secondary | ICD-10-CM | POA: Diagnosis not present

## 2021-12-08 DIAGNOSIS — M4312 Spondylolisthesis, cervical region: Secondary | ICD-10-CM | POA: Diagnosis not present

## 2021-12-20 DIAGNOSIS — M4712 Other spondylosis with myelopathy, cervical region: Secondary | ICD-10-CM | POA: Diagnosis not present

## 2021-12-20 DIAGNOSIS — M47812 Spondylosis without myelopathy or radiculopathy, cervical region: Secondary | ICD-10-CM | POA: Diagnosis not present

## 2021-12-20 DIAGNOSIS — M4802 Spinal stenosis, cervical region: Secondary | ICD-10-CM | POA: Diagnosis not present

## 2021-12-20 DIAGNOSIS — M5416 Radiculopathy, lumbar region: Secondary | ICD-10-CM | POA: Diagnosis not present

## 2021-12-20 DIAGNOSIS — M50322 Other cervical disc degeneration at C5-C6 level: Secondary | ICD-10-CM | POA: Diagnosis not present

## 2021-12-29 DIAGNOSIS — M4722 Other spondylosis with radiculopathy, cervical region: Secondary | ICD-10-CM | POA: Diagnosis not present

## 2021-12-29 DIAGNOSIS — M4802 Spinal stenosis, cervical region: Secondary | ICD-10-CM | POA: Diagnosis not present

## 2021-12-29 DIAGNOSIS — M50122 Cervical disc disorder at C5-C6 level with radiculopathy: Secondary | ICD-10-CM | POA: Diagnosis not present

## 2022-01-04 ENCOUNTER — Other Ambulatory Visit: Payer: Medicare HMO

## 2022-01-04 DIAGNOSIS — M5412 Radiculopathy, cervical region: Secondary | ICD-10-CM | POA: Diagnosis not present

## 2022-01-04 DIAGNOSIS — M5459 Other low back pain: Secondary | ICD-10-CM | POA: Diagnosis not present

## 2022-01-04 DIAGNOSIS — I10 Essential (primary) hypertension: Secondary | ICD-10-CM

## 2022-01-04 DIAGNOSIS — M5136 Other intervertebral disc degeneration, lumbar region: Secondary | ICD-10-CM | POA: Diagnosis not present

## 2022-01-04 DIAGNOSIS — R7303 Prediabetes: Secondary | ICD-10-CM

## 2022-01-04 DIAGNOSIS — M47896 Other spondylosis, lumbar region: Secondary | ICD-10-CM | POA: Diagnosis not present

## 2022-01-04 DIAGNOSIS — M5416 Radiculopathy, lumbar region: Secondary | ICD-10-CM | POA: Diagnosis not present

## 2022-01-04 LAB — BAYER DCA HB A1C WAIVED: HB A1C (BAYER DCA - WAIVED): 6.2 % — ABNORMAL HIGH (ref 4.8–5.6)

## 2022-01-05 LAB — CMP14+EGFR
ALT: 14 IU/L (ref 0–32)
AST: 17 IU/L (ref 0–40)
Albumin/Globulin Ratio: 1.2 (ref 1.2–2.2)
Albumin: 3.8 g/dL (ref 3.8–4.8)
Alkaline Phosphatase: 67 IU/L (ref 44–121)
BUN/Creatinine Ratio: 28 (ref 12–28)
BUN: 17 mg/dL (ref 8–27)
Bilirubin Total: 0.3 mg/dL (ref 0.0–1.2)
CO2: 23 mmol/L (ref 20–29)
Calcium: 9.6 mg/dL (ref 8.7–10.3)
Chloride: 105 mmol/L (ref 96–106)
Creatinine, Ser: 0.61 mg/dL (ref 0.57–1.00)
Globulin, Total: 3.3 g/dL (ref 1.5–4.5)
Glucose: 99 mg/dL (ref 70–99)
Potassium: 4.1 mmol/L (ref 3.5–5.2)
Sodium: 142 mmol/L (ref 134–144)
Total Protein: 7.1 g/dL (ref 6.0–8.5)
eGFR: 94 mL/min/{1.73_m2} (ref >59)

## 2022-01-05 LAB — CBC WITH DIFFERENTIAL/PLATELET
Basophils Absolute: 0 10*3/uL (ref 0.0–0.2)
Basos: 1 %
EOS (ABSOLUTE): 0.1 10*3/uL (ref 0.0–0.4)
Eos: 3 %
Hematocrit: 37.8 % (ref 34.0–46.6)
Hemoglobin: 12.4 g/dL (ref 11.1–15.9)
Immature Grans (Abs): 0 10*3/uL (ref 0.0–0.1)
Immature Granulocytes: 0 %
Lymphocytes Absolute: 1.5 10*3/uL (ref 0.7–3.1)
Lymphs: 39 %
MCH: 29.7 pg (ref 26.6–33.0)
MCHC: 32.8 g/dL (ref 31.5–35.7)
MCV: 90 fL (ref 79–97)
Monocytes Absolute: 0.4 10*3/uL (ref 0.1–0.9)
Monocytes: 11 %
Neutrophils Absolute: 1.8 10*3/uL (ref 1.4–7.0)
Neutrophils: 46 %
Platelets: 266 10*3/uL (ref 150–450)
RBC: 4.18 x10E6/uL (ref 3.77–5.28)
RDW: 12.9 % (ref 11.7–15.4)
WBC: 3.9 10*3/uL (ref 3.4–10.8)

## 2022-01-05 LAB — LIPID PANEL
Chol/HDL Ratio: 2.5 ratio (ref 0.0–4.4)
Cholesterol, Total: 165 mg/dL (ref 100–199)
HDL: 66 mg/dL (ref >39)
LDL Chol Calc (NIH): 82 mg/dL (ref 0–99)
Triglycerides: 91 mg/dL (ref 0–149)
VLDL Cholesterol Cal: 17 mg/dL (ref 5–40)

## 2022-01-05 LAB — TSH: TSH: 0.997 u[IU]/mL (ref 0.450–4.500)

## 2022-01-08 ENCOUNTER — Ambulatory Visit (INDEPENDENT_AMBULATORY_CARE_PROVIDER_SITE_OTHER): Payer: Medicare HMO | Admitting: Family Medicine

## 2022-01-08 ENCOUNTER — Encounter: Payer: Self-pay | Admitting: Family Medicine

## 2022-01-08 VITALS — BP 155/86 | HR 82 | Temp 97.2°F | Ht 68.0 in | Wt 164.6 lb

## 2022-01-08 DIAGNOSIS — R7303 Prediabetes: Secondary | ICD-10-CM

## 2022-01-08 DIAGNOSIS — E041 Nontoxic single thyroid nodule: Secondary | ICD-10-CM | POA: Diagnosis not present

## 2022-01-08 DIAGNOSIS — I10 Essential (primary) hypertension: Secondary | ICD-10-CM

## 2022-01-08 DIAGNOSIS — E039 Hypothyroidism, unspecified: Secondary | ICD-10-CM | POA: Diagnosis not present

## 2022-01-08 MED ORDER — LEVOTHYROXINE SODIUM 50 MCG PO TABS: 50.0000 ug | ORAL_TABLET | Freq: Every day | ORAL | 3 refills | Status: DC

## 2022-01-08 MED ORDER — HYDROCHLOROTHIAZIDE 12.5 MG PO TABS: 12.5000 mg | ORAL_TABLET | Freq: Every day | ORAL | 1 refills | Status: DC

## 2022-01-08 NOTE — Progress Notes (Signed)
 BP (!) 155/86   Pulse 82   Temp (!) 97.2 F (36.2 C) (Temporal)   Ht 5' 8" (1.727 m)   Wt 164 lb 9.6 oz (74.7 kg)   SpO2 100%   BMI 25.03 kg/m    Subjective:   Patient ID: Theresa Gamble, female    DOB: 06/30/1945, 76 y.o.   MRN: [id number]  HPI: Theresa Gamble is a 76 y.o. female presenting on 01/08/2022 for Medical Management of Chronic Issues (6 month follow up )   HPI Hypertension Patient is currently on hydrochlorothiazide, and their blood pressure today is 149/88, allowing permissive hypertension because of age. Patient denies any lightheadedness or dizziness. Patient denies headaches, blurred vision, chest pains, shortness of breath, or weakness. Denies any side effects from medication and is content with current medication.   prediabetes Patient comes in today for recheck of his diabetes. Patient has been currently taking no medicine currently, we discussed diet and reducing the sweet tea. Patient is not currently on an ACE inhibitor/ARB. Patient has not seen an ophthalmologist this year. Patient denies any issues with their feet. The symptom started onset as an adult hypertension and hypothyroidism ARE RELATED TO DM   Hypothyroidism recheck Patient is coming in for thyroid recheck today as well. They deny any issues with hair changes or heat or cold problems or diarrhea or constipation. They deny any chest pain or palpitations. They are currently on levothyroxine 50 micrograms.  Patient also history of thyroid nodule, due for repeat ultrasound.  Relevant past medical, surgical, family and social history reviewed and updated as indicated. Interim medical history since our last visit reviewed. Allergies and medications reviewed and updated.  Review of Systems  Constitutional:  Negative for chills and fever.  Eyes:  Negative for visual disturbance.  Respiratory:  Negative for chest tightness and shortness of breath.   Cardiovascular:  Negative for chest pain and leg swelling.   Musculoskeletal:  Negative for back pain and gait problem.  Skin:  Negative for rash.  Neurological:  Negative for dizziness, light-headedness and headaches.  Psychiatric/Behavioral:  Negative for agitation and behavioral problems.   All other systems reviewed and are negative.   Per HPI unless specifically indicated above   Allergies as of 01/08/2022       Reactions   Latex Rash        Medication List        Accurate as of January 08, 2022 10:17 AM. If you have any questions, ask your nurse or doctor.          aspirin EC 81 MG tablet Take 81 mg by mouth daily.   calcium carbonate 1500 (600 Ca) MG Tabs tablet Commonly known as: OSCAL Take 600 mg of elemental calcium by mouth daily with breakfast.   diclofenac Sodium 1 % Gel Commonly known as: Voltaren Apply 2 g topically 4 (four) times daily. Give 90 day supply   hydrochlorothiazide 12.5 MG tablet Commonly known as: HYDRODIURIL Take 1 tablet (12.5 mg total) by mouth daily.   levothyroxine 50 MCG tablet Commonly known as: SYNTHROID Take 1 tablet (50 mcg total) by mouth daily.   multivitamin tablet Take 1 tablet by mouth daily.         Objective:   BP (!) 155/86   Pulse 82   Temp (!) 97.2 F (36.2 C) (Temporal)   Ht 5' 8" (1.727 m)   Wt 164 lb 9.6 oz (74.7 kg)   SpO2 100%   BMI 25.03 kg/m     Wt Readings from Last 3 Encounters:  01/08/22 164 lb 9.6 oz (74.7 kg)  07/07/21 161 lb (73 kg)  04/11/21 160 lb (72.6 kg)    Physical Exam Vitals and nursing note reviewed.  Constitutional:      General: She is not in acute distress.    Appearance: She is well-developed. She is not diaphoretic.  Eyes:     Conjunctiva/sclera: Conjunctivae normal.  Cardiovascular:     Rate and Rhythm: Normal rate and regular rhythm.     Heart sounds: Normal heart sounds. No murmur heard. Pulmonary:     Effort: Pulmonary effort is normal. No respiratory distress.     Breath sounds: Normal breath sounds. No wheezing.   Musculoskeletal:        General: No swelling or tenderness. Normal range of motion.  Skin:    General: Skin is warm and dry.     Findings: No rash.  Neurological:     Mental Status: She is alert and oriented to person, place, and time.     Coordination: Coordination normal.  Psychiatric:        Behavior: Behavior normal.     Results for orders placed or performed in visit on 01/04/22  CBC with Differential/Platelet  Result Value Ref Range   WBC 3.9 3.4 - 10.8 x10E3/uL   RBC 4.18 3.77 - 5.28 x10E6/uL   Hemoglobin 12.4 11.1 - 15.9 g/dL   Hematocrit 37.8 34.0 - 46.6 %   MCV 90 79 - 97 fL   MCH 29.7 26.6 - 33.0 pg   MCHC 32.8 31.5 - 35.7 g/dL   RDW 12.9 11.7 - 15.4 %   Platelets 266 150 - 450 x10E3/uL   Neutrophils 46 Not Estab. %   Lymphs 39 Not Estab. %   Monocytes 11 Not Estab. %   Eos 3 Not Estab. %   Basos 1 Not Estab. %   Neutrophils Absolute 1.8 1.4 - 7.0 x10E3/uL   Lymphocytes Absolute 1.5 0.7 - 3.1 x10E3/uL   Monocytes Absolute 0.4 0.1 - 0.9 x10E3/uL   EOS (ABSOLUTE) 0.1 0.0 - 0.4 x10E3/uL   Basophils Absolute 0.0 0.0 - 0.2 x10E3/uL   Immature Granulocytes 0 Not Estab. %   Immature Grans (Abs) 0.0 0.0 - 0.1 x10E3/uL  CMP14+EGFR  Result Value Ref Range   Glucose 99 70 - 99 mg/dL   BUN 17 8 - 27 mg/dL   Creatinine, Ser 0.61 0.57 - 1.00 mg/dL   eGFR 94 >59 mL/min/1.73   BUN/Creatinine Ratio 28 12 - 28   Sodium 142 134 - 144 mmol/L   Potassium 4.1 3.5 - 5.2 mmol/L   Chloride 105 96 - 106 mmol/L   CO2 23 20 - 29 mmol/L   Calcium 9.6 8.7 - 10.3 mg/dL   Total Protein 7.1 6.0 - 8.5 g/dL   Albumin 3.8 3.8 - 4.8 g/dL   Globulin, Total 3.3 1.5 - 4.5 g/dL   Albumin/Globulin Ratio 1.2 1.2 - 2.2   Bilirubin Total 0.3 0.0 - 1.2 mg/dL   Alkaline Phosphatase 67 44 - 121 IU/L   AST 17 0 - 40 IU/L   ALT 14 0 - 32 IU/L  Lipid panel  Result Value Ref Range   Cholesterol, Total 165 100 - 199 mg/dL   Triglycerides 91 0 - 149 mg/dL   HDL 66 >39 mg/dL   VLDL Cholesterol  Cal 17 5 - 40 mg/dL   LDL Chol Calc (NIH) 82 0 - 99 mg/dL   Chol/HDL Ratio 2.5 0.0 -   4.4 ratio  TSH  Result Value Ref Range   TSH 0.997 0.450 - 4.500 uIU/mL  Bayer DCA Hb A1c Waived  Result Value Ref Range   HB A1C (BAYER DCA - WAIVED) 6.2 (H) 4.8 - 5.6 %    Assessment & Plan:   Problem List Items Addressed This Visit       Cardiovascular and Mediastinum   Hypertension - Primary   Relevant Medications   hydrochlorothiazide (HYDRODIURIL) 12.5 MG tablet     Endocrine   Hypothyroidism   Relevant Medications   levothyroxine (SYNTHROID) 50 MCG tablet   Other Relevant Orders   US THYROID   Right thyroid nodule   Relevant Medications   levothyroxine (SYNTHROID) 50 MCG tablet   Other Relevant Orders   US THYROID     Other   Prediabetes    Repeat ultrasound because it has been a year.  Blood sugar was borderline with an A1c of 6.2. Follow up plan: Return in about 6 months (around 07/09/2022), or if symptoms worsen or fail to improve, for Prediabetes and hypertension.  Counseling provided for all of the vaccine components Orders Placed This Encounter  Procedures   US THYROID    Joshua Dettinger, MD [facility] 01/08/2022, 10:17 AM     

## 2022-01-16 ENCOUNTER — Ambulatory Visit (HOSPITAL_COMMUNITY)
Admission: RE | Admit: 2022-01-16 | Discharge: 2022-01-16 | Disposition: A | Discharge status: Home / Self Care | Payer: Medicare HMO | Source: Ambulatory Visit | Attending: Family Medicine | Admitting: Family Medicine

## 2022-01-16 DIAGNOSIS — E041 Nontoxic single thyroid nodule: Secondary | ICD-10-CM | POA: Diagnosis not present

## 2022-01-16 DIAGNOSIS — E039 Hypothyroidism, unspecified: Secondary | ICD-10-CM | POA: Insufficient documentation

## 2022-01-18 DIAGNOSIS — M5412 Radiculopathy, cervical region: Secondary | ICD-10-CM | POA: Diagnosis not present

## 2022-01-22 ENCOUNTER — Other Ambulatory Visit: Payer: Self-pay | Admitting: Family Medicine

## 2022-01-22 DIAGNOSIS — Z1231 Encounter for screening mammogram for malignant neoplasm of breast: Secondary | ICD-10-CM

## 2022-01-24 ENCOUNTER — Ambulatory Visit
Admission: RE | Admit: 2022-01-24 | Discharge: 2022-01-24 | Disposition: A | Discharge status: Home / Self Care | Payer: Medicare HMO | Source: Ambulatory Visit | Attending: Family Medicine | Admitting: Family Medicine

## 2022-01-24 DIAGNOSIS — Z1231 Encounter for screening mammogram for malignant neoplasm of breast: Secondary | ICD-10-CM | POA: Diagnosis not present

## 2022-02-09 ENCOUNTER — Ambulatory Visit (INDEPENDENT_AMBULATORY_CARE_PROVIDER_SITE_OTHER): Payer: Medicare HMO

## 2022-02-09 VITALS — Ht 68.0 in | Wt 162.0 lb

## 2022-02-09 DIAGNOSIS — Z0000 Encounter for general adult medical examination without abnormal findings: Secondary | ICD-10-CM

## 2022-02-09 NOTE — Progress Notes (Signed)
Subjective:   Ernie AvenaLinda Demery is a 76 y.o. female who presents for Medicare Annual (Subsequent) preventive examination. I connected with  Ernie AvenaLinda Lamp on 02/09/22 by a audio enabled telemedicine application and verified that I am speaking with the correct person using two identifiers.  Patient Location: Home  Provider Location: Home Office  I discussed the limitations of evaluation and management by telemedicine. The patient expressed understanding and agreed to proceed.  Review of Systems     Cardiac Risk Factors include: advanced age (>4355men, 61>65 women);hypertension     Objective:    Today's Vitals   02/09/22 0833  Weight: 162 lb (73.5 kg)  Height: 5\' 8"  (1.727 m)   Body mass index is 24.63 kg/m.     02/09/2022    8:38 AM 02/08/2021    8:21 AM 02/08/2020    8:43 AM 01/07/2020    5:48 PM 02/04/2019    8:34 AM  Advanced Directives  Does Patient Have a Medical Advance Directive? No No No No No  Would patient like information on creating a medical advance directive? No - Patient declined No - Patient declined No - Patient declined  No - Patient declined    Current Medications (verified) Outpatient Encounter Medications as of 02/09/2022  Medication Sig   aspirin EC 81 MG tablet Take 81 mg by mouth daily.   calcium carbonate (OSCAL) 1500 (600 Ca) MG TABS tablet Take 600 mg of elemental calcium by mouth daily with breakfast.   diclofenac Sodium (VOLTAREN) 1 % GEL Apply 2 g topically 4 (four) times daily. Give 90 day supply   hydrochlorothiazide (HYDRODIURIL) 12.5 MG tablet Take 1 tablet (12.5 mg total) by mouth daily.   levothyroxine (SYNTHROID) 50 MCG tablet Take 1 tablet (50 mcg total) by mouth daily.   Multiple Vitamin (MULTIVITAMIN) tablet Take 1 tablet by mouth daily.   No facility-administered encounter medications on file as of 02/09/2022.    Allergies (verified) Latex   History: Past Medical History:  Diagnosis Date   Allergy    seasonal allergies    Arthritis    back   Cataract    bilateral sx   Hypertension    Past Surgical History:  Procedure Laterality Date   ABDOMINAL HYSTERECTOMY     BREAST BIOPSY     CHOLECYSTECTOMY     EYE SURGERY Bilateral    cataract removal   Family History  Problem Relation Age of Onset   Heart disease Mother    Diabetes Mother    Stroke Father    Colon cancer Neg Hx    Colon polyps Neg Hx    Esophageal cancer Neg Hx    Rectal cancer Neg Hx    Stomach cancer Neg Hx    Breast cancer Neg Hx    Social History   Socioeconomic History   Marital status: Married    Spouse name: Sharon SellerLloyd   Number of children: 2   Years of education: 12   Highest education level: High school graduate  Occupational History   Occupation: retired    Associate Professormployer: [employer]    Comment: 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  Tobacco Use   Smoking status: Never   Smokeless tobacco: Never  Vaping Use   Vaping Use: Never used  Substance and Sexual Activity   Alcohol use: Never   Drug use: Never   Sexual activity: Yes    Birth control/protection: Surgical  Other Topics Concern   Not on file  Social History Narrative   Not on  file   Social Determinants of Health   Financial Resource Strain: Low Risk  (02/09/2022)   Overall Financial Resource Strain (CARDIA)    Difficulty of Paying Living Expenses: Not hard at all  Food Insecurity: No Food Insecurity (02/09/2022)   Hunger Vital Sign    Worried About Running Out of Food in the Last Year: Never true    Ran Out of Food in the Last Year: Never true  Transportation Needs: No Transportation Needs (02/09/2022)   PRAPARE - Administrator, Civil Service (Medical): No    Lack of Transportation (Non-Medical): No  Physical Activity: Insufficiently Active (02/09/2022)   Exercise Vital Sign    Days of Exercise per Week: 3 days    Minutes of Exercise per Session: 30 min  Stress: No Stress Concern Present (02/09/2022)   [employer] of Occupational Health - Occupational  Stress Questionnaire    Feeling of Stress : Not at all  Social Connections: Moderately Integrated (02/09/2022)   Social Connection and Isolation Panel [NHANES]    Frequency of Communication with Friends and Family: More than three times a week    Frequency of Social Gatherings with Friends and Family: More than three times a week    Attends Religious Services: More than 4 times per year    Active Member of [employer] or Organizations: No    Attends Engineer, structural: Never    Marital Status: Married    Tobacco Counseling Counseling given: Not Answered   Clinical Intake:  Pre-visit preparation completed: Yes  Pain : No/denies pain     Nutritional Risks: None Diabetes: No  How often do you need to have someone help you when you read instructions, pamphlets, or other written materials from your doctor or pharmacy?: 1 - Never  Diabetic?no   Interpreter Needed?: No  Information entered by :: Renie Ora, LPN   Activities of Daily Living    02/09/2022    8:38 AM  In your present state of health, do you have any difficulty performing the following activities:  Hearing? 0  Vision? 0  Difficulty concentrating or making decisions? 0  Walking or climbing stairs? 0  Dressing or bathing? 0  Doing errands, shopping? 0  Preparing Food and eating ? N  Using the Toilet? N  In the past six months, have you accidently leaked urine? N  Do you have problems with loss of bowel control? N  Managing your Medications? N  Managing your Finances? N  Housekeeping or managing your Housekeeping? N    Patient Care Team: Dettinger, Elige Radon, MD as PCP - General (Family Medicine) Sheran Luz, MD as Consulting Physician (Physical Medicine and Rehabilitation)  Indicate any recent Medical Services you may have received from other than [facility] providers in the past year (date may be approximate).     Assessment:   This is a routine wellness examination for [place].  Hearing/Vision  screen Vision Screening - Comments:: Wears rx glasses - up to date with routine eye exams with  [facility]   Dietary issues and exercise activities discussed: Current Exercise Habits: Home exercise routine, Type of exercise: walking, Time (Minutes): 30, Frequency (Times/Week): 3, Weekly Exercise (Minutes/Week): 90, Intensity: Mild, Exercise limited by: None identified   Goals Addressed             This Visit's Progress    DIET - INCREASE WATER INTAKE   On track    Try to drink 6-8 glasses of water daily  Depression Screen    02/09/2022    8:36 AM 01/08/2022    9:54 AM 07/07/2021    9:59 AM 04/11/2021    8:49 AM 02/08/2021    8:19 AM 01/06/2021    8:04 AM 08/05/2020    8:52 AM  PHQ 2/9 Scores  PHQ - 2 Score 0 0 0 0 0 0 0  PHQ- 9 Score 0 0 0 0       Fall Risk    02/09/2022    8:34 AM 01/08/2022    9:54 AM 07/07/2021    9:59 AM 04/11/2021    8:48 AM 02/08/2021    8:21 AM  Fall Risk   Falls in the past year? 0 0 0 0 0  Number falls in past yr: 0   0 0  Injury with Fall? 0   0 0  Risk for fall due to : No Fall Risks   No Fall Risks Orthopedic patient  Follow up Falls prevention discussed    Falls prevention discussed    FALL RISK PREVENTION PERTAINING TO THE HOME:  Any stairs in or around the home? Yes  If so, are there any without handrails? No  Home free of loose throw rugs in walkways, pet beds, electrical cords, etc? Yes  Adequate lighting in your home to reduce risk of falls? Yes   ASSISTIVE DEVICES UTILIZED TO PREVENT FALLS:  Life alert? No  Use of a cane, walker or w/c? No  Grab bars in the bathroom? No  Shower chair or bench in shower? Yes  Elevated toilet seat or a handicapped toilet? No          02/09/2022    8:39 AM 02/08/2020    9:10 AM 02/04/2019    8:38 AM  6CIT Screen  What Year? 0 points 0 points 0 points  What month? 0 points 0 points 0 points  What time? 0 points 0 points 0 points  Count back from 20 0 points 0 points 0 points   Months in reverse 0 points 0 points 0 points  Repeat phrase 0 points 0 points 0 points  Total Score 0 points 0 points 0 points    Immunizations Immunization History  Administered Date(s) Administered   Fluad Quad(high Dose 65+) 11/23/2021   Influenza, High Dose Seasonal PF 12/06/2014, 11/30/2015, 11/19/2017, 10/28/2018   Influenza-Unspecified 12/01/2013, 11/20/2016, 11/22/2019, 12/06/2020   PFIZER(Purple Top)SARS-COV-2 Vaccination 03/08/2019, 03/26/2019, 11/05/2019, 06/30/2020   Pfizer Covid-19 Vaccine Bivalent Booster 26yrs & up 01/19/2021   Pneumococcal Conjugate-13 02/17/2015, 12/18/2016   Pneumococcal Polysaccharide-23 12/01/2013, 10/28/2018   Tdap 12/18/2016   Zoster Recombinat (Shingrix) 06/11/2017, 08/13/2017   Zoster, Live 12/05/2013    TDAP status: Up to date  Flu Vaccine status: Up to date  Pneumococcal vaccine status: Up to date  Covid-19 vaccine status: Completed vaccines  Qualifies for Shingles Vaccine? Yes   Zostavax completed Yes   Shingrix Completed?: Yes  Screening Tests Health Maintenance  Topic Date Due   COVID-19 Vaccine (6 - 2023-24 season) 10/20/2021   Medicare Annual Wellness (AWV)  02/10/2023   MAMMOGRAM  01/25/2024   DTaP/Tdap/Td (2 - Td or Tdap) 12/19/2026   COLONOSCOPY (Pts 45-62yrs Insurance coverage will need to be confirmed)  03/24/2030   Pneumonia Vaccine 68+ Years old  Completed   INFLUENZA VACCINE  Completed   DEXA SCAN  Completed   Zoster Vaccines- Shingrix  Completed   HPV VACCINES  Aged Out   Hepatitis C Screening  Discontinued    Health  Maintenance  Health Maintenance Due  Topic Date Due   COVID-19 Vaccine (6 - 2023-24 season) 10/20/2021    Colorectal cancer screening: Type of screening: Colonoscopy. Completed 03/24/2020. Repeat every 10 years  Mammogram status: Completed 01/24/2022. Repeat every year  Bone Density status: Completed 01/04/2020. Results reflect: Bone density results: OSTEOPENIA. Repeat every 5  years.  Lung Cancer Screening: (Low Dose CT Chest recommended if Age 53-80 years, 30 pack-year currently smoking OR have quit w/in 15years.) does not qualify.   Lung Cancer Screening Referral: n/a  Additional Screening:  Hepatitis C Screening: does not qualify;   Vision Screening: Recommended annual ophthalmology exams for early detection of glaucoma and other disorders of the eye. Is the patient up to date with their annual eye exam?  Yes  Who is the provider or what is the name of the office in which the patient attends annual eye exams? [facility]  If pt is not established with a provider, would they like to be referred to a provider to establish care? No .   Dental Screening: Recommended annual dental exams for proper oral hygiene  Community Resource Referral / Chronic Care Management: CRR required this visit?  No   CCM required this visit?  No      Plan:     I have personally reviewed and noted the following in the patient's chart:   Medical and social history Use of alcohol, tobacco or illicit drugs  Current medications and supplements including opioid prescriptions. Patient is not currently taking opioid prescriptions. Functional ability and status Nutritional status Physical activity Advanced directives List of other physicians Hospitalizations, surgeries, and ER visits in previous 12 months Vitals Screenings to include cognitive, depression, and falls Referrals and appointments  In addition, I have reviewed and discussed with patient certain preventive protocols, quality metrics, and best practice recommendations. A written personalized care plan for preventive services as well as general preventive health recommendations were provided to patient.     Lorrene Reid, LPN   16/11/9602   Nurse Notes: None

## 2022-02-09 NOTE — Patient Instructions (Signed)
Theresa Gamble , Thank you for taking time to come for your Medicare Wellness Visit. I appreciate your ongoing commitment to your health goals. Please review the following plan we discussed and let me know if I can assist you in the future.   These are the goals we discussed:  Goals      DIET - INCREASE WATER INTAKE     Try to drink 6-8 glasses of water daily     DIET - INCREASE WATER INTAKE     Pt wanted to add this one again     Patient Stated     Continue PT on lower back and see what next steps are for getting pain free. -01/2020 Now getting injections with Dr Nelva Bush. - 01/2021        This is a list of the screening recommended for you and due dates:  Health Maintenance  Topic Date Due   COVID-19 Vaccine (6 - 2023-24 season) 10/20/2021   Medicare Annual Wellness Visit  02/10/2023   Mammogram  01/25/2024   DTaP/Tdap/Td vaccine (2 - Td or Tdap) 12/19/2026   Colon Cancer Screening  03/24/2030   Pneumonia Vaccine  Completed   Flu Shot  Completed   DEXA scan (bone density measurement)  Completed   Zoster (Shingles) Vaccine  Completed   HPV Vaccine  Aged Out   Hepatitis C Screening: USPSTF Recommendation to screen - Ages 36-79 yo.  Discontinued    Advanced directives: Advance directive discussed with you today. I have provided a copy for you to complete at home and have notarized. Once this is complete please bring a copy in to our office so we can scan it into your chart.   Conditions/risks identified: Aim for 30 minutes of exercise or brisk walking, 6-8 glasses of water, and 5 servings of fruits and vegetables each day.   Next appointment: Follow up in one year for your annual wellness visit    Preventive Care 65 Years and Older, Female Preventive care refers to lifestyle choices and visits with your health care provider that can promote health and wellness. What does preventive care include? A yearly physical exam. This is also called an annual well check. Dental exams once  or twice a year. Routine eye exams. Ask your health care provider how often you should have your eyes checked. Personal lifestyle choices, including: Daily care of your teeth and gums. Regular physical activity. Eating a healthy diet. Avoiding tobacco and drug use. Limiting alcohol use. Practicing safe sex. Taking low-dose aspirin every day. Taking vitamin and mineral supplements as recommended by your health care provider. What happens during an annual well check? The services and screenings done by your health care provider during your annual well check will depend on your age, overall health, lifestyle risk factors, and family history of disease. Counseling  Your health care provider may ask you questions about your: Alcohol use. Tobacco use. Drug use. Emotional well-being. Home and relationship well-being. Sexual activity. Eating habits. History of falls. Memory and ability to understand (cognition). Work and work Statistician. Reproductive health. Screening  You may have the following tests or measurements: Height, weight, and BMI. Blood pressure. Lipid and cholesterol levels. These may be checked every 5 years, or more frequently if you are over 19 years old. Skin check. Lung cancer screening. You may have this screening every year starting at age 85 if you have a 30-pack-year history of smoking and currently smoke or have quit within the past 15 years. Fecal occult  blood test (FOBT) of the stool. You may have this test every year starting at age 17. Flexible sigmoidoscopy or colonoscopy. You may have a sigmoidoscopy every 5 years or a colonoscopy every 10 years starting at age 87. Hepatitis C blood test. Hepatitis B blood test. Sexually transmitted disease (STD) testing. Diabetes screening. This is done by checking your blood sugar (glucose) after you have not eaten for a while (fasting). You may have this done every 1-3 years. Bone density scan. This is done to screen  for osteoporosis. You may have this done starting at age 69. Mammogram. This may be done every 1-2 years. Talk to your health care provider about how often you should have regular mammograms. Talk with your health care provider about your test results, treatment options, and if necessary, the need for more tests. Vaccines  Your health care provider may recommend certain vaccines, such as: Influenza vaccine. This is recommended every year. Tetanus, diphtheria, and acellular pertussis (Tdap, Td) vaccine. You may need a Td booster every 10 years. Zoster vaccine. You may need this after age 53. Pneumococcal 13-valent conjugate (PCV13) vaccine. One dose is recommended after age 36. Pneumococcal polysaccharide (PPSV23) vaccine. One dose is recommended after age 63. Talk to your health care provider about which screenings and vaccines you need and how often you need them. This information is not intended to replace advice given to you by your health care provider. Make sure you discuss any questions you have with your health care provider. Document Released: 03/04/2015 Document Revised: 10/26/2015 Document Reviewed: 12/07/2014 Elsevier Interactive Patient Education  2017 [place] Prevention in the Home Falls can cause injuries. They can happen to people of all ages. There are many things you can do to make your home safe and to help prevent falls. What can I do on the outside of my home? Regularly fix the edges of walkways and driveways and fix any cracks. Remove anything that might make you trip as you walk through a door, such as a raised step or threshold. Trim any bushes or trees on the path to your home. Use bright outdoor lighting. Clear any walking paths of anything that might make someone trip, such as rocks or tools. Regularly check to see if handrails are loose or broken. Make sure that both sides of any steps have handrails. Any raised decks and porches should have guardrails  on the edges. Have any leaves, snow, or ice cleared regularly. Use sand or salt on walking paths during winter. Clean up any spills in your garage right away. This includes oil or grease spills. What can I do in the bathroom? Use night lights. Install grab bars by the toilet and in the tub and shower. Do not use towel bars as grab bars. Use non-skid mats or decals in the tub or shower. If you need to sit down in the shower, use a plastic, non-slip stool. Keep the floor dry. Clean up any water that spills on the floor as soon as it happens. Remove soap buildup in the tub or shower regularly. Attach bath mats securely with double-sided non-slip rug tape. Do not have throw rugs and other things on the floor that can make you trip. What can I do in the bedroom? Use night lights. Make sure that you have a light by your bed that is easy to reach. Do not use any sheets or blankets that are too big for your bed. They should not hang down onto the floor.  Have a firm chair that has side arms. You can use this for support while you get dressed. Do not have throw rugs and other things on the floor that can make you trip. What can I do in the kitchen? Clean up any spills right away. Avoid walking on wet floors. Keep items that you use a lot in easy-to-reach places. If you need to reach something above you, use a strong step stool that has a grab bar. Keep electrical cords out of the way. Do not use floor polish or wax that makes floors slippery. If you must use wax, use non-skid floor wax. Do not have throw rugs and other things on the floor that can make you trip. What can I do with my stairs? Do not leave any items on the stairs. Make sure that there are handrails on both sides of the stairs and use them. Fix handrails that are broken or loose. Make sure that handrails are as long as the stairways. Check any carpeting to make sure that it is firmly attached to the stairs. Fix any carpet that is  loose or worn. Avoid having throw rugs at the top or bottom of the stairs. If you do have throw rugs, attach them to the floor with carpet tape. Make sure that you have a light switch at the top of the stairs and the bottom of the stairs. If you do not have them, ask someone to add them for you. What else can I do to help prevent falls? Wear shoes that: Do not have high heels. Have rubber bottoms. Are comfortable and fit you well. Are closed at the toe. Do not wear sandals. If you use a stepladder: Make sure that it is fully opened. Do not climb a closed stepladder. Make sure that both sides of the stepladder are locked into place. Ask someone to hold it for you, if possible. Clearly mark and make sure that you can see: Any grab bars or handrails. First and last steps. Where the edge of each step is. Use tools that help you move around (mobility aids) if they are needed. These include: Canes. Walkers. Scooters. Crutches. Turn on the lights when you go into a dark area. Replace any light bulbs as soon as they burn out. Set up your furniture so you have a clear path. Avoid moving your furniture around. If any of your floors are uneven, fix them. If there are any pets around you, be aware of where they are. Review your medicines with your doctor. Some medicines can make you feel dizzy. This can increase your chance of falling. Ask your doctor what other things that you can do to help prevent falls. This information is not intended to replace advice given to you by your health care provider. Make sure you discuss any questions you have with your health care provider. Document Released: 12/02/2008 Document Revised: 07/14/2015 Document Reviewed: 03/12/2014 Elsevier Interactive Patient Education  2017 [employer].

## 2022-05-07 DIAGNOSIS — Z79899 Other long term (current) drug therapy: Secondary | ICD-10-CM | POA: Diagnosis not present

## 2022-05-07 DIAGNOSIS — M5412 Radiculopathy, cervical region: Secondary | ICD-10-CM | POA: Diagnosis not present

## 2022-05-07 DIAGNOSIS — M5416 Radiculopathy, lumbar region: Secondary | ICD-10-CM | POA: Diagnosis not present

## 2022-05-07 DIAGNOSIS — Z5181 Encounter for therapeutic drug level monitoring: Secondary | ICD-10-CM | POA: Diagnosis not present

## 2022-05-07 DIAGNOSIS — M5136 Other intervertebral disc degeneration, lumbar region: Secondary | ICD-10-CM | POA: Diagnosis not present

## 2022-05-14 DIAGNOSIS — H903 Sensorineural hearing loss, bilateral: Secondary | ICD-10-CM | POA: Diagnosis not present

## 2022-05-24 DIAGNOSIS — M5412 Radiculopathy, cervical region: Secondary | ICD-10-CM | POA: Diagnosis not present

## 2022-07-03 ENCOUNTER — Other Ambulatory Visit: Payer: Self-pay | Admitting: Family Medicine

## 2022-07-03 ENCOUNTER — Other Ambulatory Visit: Payer: Medicare HMO

## 2022-07-03 DIAGNOSIS — E039 Hypothyroidism, unspecified: Secondary | ICD-10-CM | POA: Diagnosis not present

## 2022-07-03 DIAGNOSIS — R7303 Prediabetes: Secondary | ICD-10-CM | POA: Diagnosis not present

## 2022-07-03 DIAGNOSIS — I10 Essential (primary) hypertension: Secondary | ICD-10-CM | POA: Diagnosis not present

## 2022-07-03 DIAGNOSIS — E559 Vitamin D deficiency, unspecified: Secondary | ICD-10-CM | POA: Diagnosis not present

## 2022-07-03 LAB — BAYER DCA HB A1C WAIVED: HB A1C (BAYER DCA - WAIVED): 6.1 % — ABNORMAL HIGH (ref 4.8–5.6)

## 2022-07-04 LAB — LIPID PANEL
Chol/HDL Ratio: 2.7 ratio (ref 0.0–4.4)
Cholesterol, Total: 170 mg/dL (ref 100–199)
HDL: 64 mg/dL (ref >39)
LDL Chol Calc (NIH): 88 mg/dL (ref 0–99)
Triglycerides: 99 mg/dL (ref 0–149)
VLDL Cholesterol Cal: 18 mg/dL (ref 5–40)

## 2022-07-04 LAB — CBC WITH DIFFERENTIAL/PLATELET
Basophils Absolute: 0 10*3/uL (ref 0.0–0.2)
Basos: 0 %
EOS (ABSOLUTE): 0.1 10*3/uL (ref 0.0–0.4)
Eos: 2 %
Hematocrit: 36.6 % (ref 34.0–46.6)
Hemoglobin: 12.5 g/dL (ref 11.1–15.9)
Immature Grans (Abs): 0 10*3/uL (ref 0.0–0.1)
Immature Granulocytes: 0 %
Lymphocytes Absolute: 1.7 10*3/uL (ref 0.7–3.1)
Lymphs: 34 %
MCH: 30.6 pg (ref 26.6–33.0)
MCHC: 34.2 g/dL (ref 31.5–35.7)
MCV: 90 fL (ref 79–97)
Monocytes Absolute: 0.5 10*3/uL (ref 0.1–0.9)
Monocytes: 9 %
Neutrophils Absolute: 2.9 10*3/uL (ref 1.4–7.0)
Neutrophils: 55 %
Platelets: 235 10*3/uL (ref 150–450)
RBC: 4.08 x10E6/uL (ref 3.77–5.28)
RDW: 12.7 % (ref 11.7–15.4)
WBC: 5.2 10*3/uL (ref 3.4–10.8)

## 2022-07-04 LAB — CMP14+EGFR
ALT: 12 IU/L (ref 0–32)
AST: 20 IU/L (ref 0–40)
Albumin/Globulin Ratio: 1.4 (ref 1.2–2.2)
Albumin: 3.9 g/dL (ref 3.8–4.8)
Alkaline Phosphatase: 55 IU/L (ref 44–121)
BUN/Creatinine Ratio: 30 — ABNORMAL HIGH (ref 12–28)
BUN: 16 mg/dL (ref 8–27)
Bilirubin Total: 0.3 mg/dL (ref 0.0–1.2)
CO2: 22 mmol/L (ref 20–29)
Calcium: 9.5 mg/dL (ref 8.7–10.3)
Chloride: 105 mmol/L (ref 96–106)
Creatinine, Ser: 0.54 mg/dL — ABNORMAL LOW (ref 0.57–1.00)
Globulin, Total: 2.8 g/dL (ref 1.5–4.5)
Glucose: 96 mg/dL (ref 70–99)
Potassium: 3.9 mmol/L (ref 3.5–5.2)
Sodium: 141 mmol/L (ref 134–144)
Total Protein: 6.7 g/dL (ref 6.0–8.5)
eGFR: 96 mL/min/{1.73_m2} (ref >59)

## 2022-07-04 LAB — VITAMIN D 25 HYDROXY (VIT D DEFICIENCY, FRACTURES): Vit D, 25-Hydroxy: 48.6 ng/mL (ref 30.0–100.0)

## 2022-07-04 LAB — TSH: TSH: 1.53 u[IU]/mL (ref 0.450–4.500)

## 2022-07-05 DIAGNOSIS — H52223 Regular astigmatism, bilateral: Secondary | ICD-10-CM | POA: Diagnosis not present

## 2022-07-05 DIAGNOSIS — H04123 Dry eye syndrome of bilateral lacrimal glands: Secondary | ICD-10-CM | POA: Diagnosis not present

## 2022-07-05 DIAGNOSIS — Z961 Presence of intraocular lens: Secondary | ICD-10-CM | POA: Diagnosis not present

## 2022-07-05 DIAGNOSIS — H43813 Vitreous degeneration, bilateral: Secondary | ICD-10-CM | POA: Diagnosis not present

## 2022-07-05 DIAGNOSIS — H524 Presbyopia: Secondary | ICD-10-CM | POA: Diagnosis not present

## 2022-07-05 DIAGNOSIS — H35033 Hypertensive retinopathy, bilateral: Secondary | ICD-10-CM | POA: Diagnosis not present

## 2022-07-09 ENCOUNTER — Ambulatory Visit (INDEPENDENT_AMBULATORY_CARE_PROVIDER_SITE_OTHER): Payer: Medicare HMO | Admitting: Family Medicine

## 2022-07-09 ENCOUNTER — Encounter: Payer: Self-pay | Admitting: Family Medicine

## 2022-07-09 VITALS — BP 129/74 | HR 76 | Temp 97.1°F | Ht 68.0 in | Wt 165.8 lb

## 2022-07-09 DIAGNOSIS — I10 Essential (primary) hypertension: Secondary | ICD-10-CM | POA: Diagnosis not present

## 2022-07-09 DIAGNOSIS — R7303 Prediabetes: Secondary | ICD-10-CM | POA: Diagnosis not present

## 2022-07-09 DIAGNOSIS — E039 Hypothyroidism, unspecified: Secondary | ICD-10-CM | POA: Diagnosis not present

## 2022-07-09 MED ORDER — HYDROCHLOROTHIAZIDE 12.5 MG PO TABS: 12.5000 mg | ORAL_TABLET | Freq: Every day | ORAL | 1 refills | Status: DC

## 2022-07-09 NOTE — Progress Notes (Signed)
BP 129/74   Pulse 76   Temp (!) 97.1 F (36.2 C) (Temporal)   Ht 5\' 8"  (1.727 m)   Wt 165 lb 12.8 oz (75.2 kg)   SpO2 98%   BMI 25.21 kg/m    Subjective:   Patient ID: Theresa Gamble, female    DOB: [birth year], 77 y.o.   MRN: [id number]  HPI: Theresa Gamble is a 77 y.o. female presenting on 07/09/2022 for Medical Management of Chronic Issues (6 month)   HPI Prediabetes Patient comes in today for recheck of his diabetes. Patient has been currently taking no medications, focus on diet, A1c was 6.1 which is good, slightly improved from before. Patient is not currently on an ACE inhibitor/ARB. Patient has not seen an ophthalmologist this year. Patient denies any new issues with their feet. The symptom started onset as an adult hypertension and hypothyroidism ARE RELATED TO DM   Hypertension Patient is currently on hydrochlorothiazide, and their blood pressure today is 129/74. Patient denies any lightheadedness or dizziness. Patient denies headaches, blurred vision, chest pains, shortness of breath, or weakness. Denies any side effects from medication and is content with current medication.   Hypothyroidism recheck Patient is coming in for thyroid recheck today as well. They deny any issues with hair changes or heat or cold problems or diarrhea or constipation. They deny any chest pain or palpitations. They are currently on levothyroxine 50 micrograms   Relevant past medical, surgical, family and social history reviewed and updated as indicated. Interim medical history since our last visit reviewed. Allergies and medications reviewed and updated.  Review of Systems  Constitutional:  Negative for chills and fever.  Eyes:  Negative for visual disturbance.  Respiratory:  Negative for chest tightness and shortness of breath.   Cardiovascular:  Negative for chest pain and leg swelling.  Genitourinary:  Negative for difficulty urinating and dysuria.  Musculoskeletal:  Negative for back pain  and gait problem.  Skin:  Negative for rash.  Neurological:  Negative for dizziness, light-headedness and headaches.  Psychiatric/Behavioral:  Negative for agitation and behavioral problems.   All other systems reviewed and are negative.   Per HPI unless specifically indicated above   Allergies as of 07/09/2022       Reactions   Latex Rash        Medication List        Accurate as of Jul 09, 2022 10:30 AM. If you have any questions, ask your nurse or doctor.          aspirin EC 81 MG tablet Take 81 mg by mouth daily.   calcium carbonate 1500 (600 Ca) MG Tabs tablet Commonly known as: OSCAL Take 600 mg of elemental calcium by mouth daily with breakfast.   diclofenac Sodium 1 % Gel Commonly known as: Voltaren Apply 2 g topically 4 (four) times daily. Give 90 day supply   hydrochlorothiazide 12.5 MG tablet Commonly known as: HYDRODIURIL Take 1 tablet (12.5 mg total) by mouth daily.   levothyroxine 50 MCG tablet Commonly known as: SYNTHROID Take 1 tablet (50 mcg total) by mouth daily.   multivitamin tablet Take 1 tablet by mouth daily.   Ultram 50 MG tablet Generic drug: traMADol Take 1 tablet 3 times a day by oral route as needed for pain.         Objective:   BP 129/74   Pulse 76   Temp (!) 97.1 F (36.2 C) (Temporal)   Ht 5\' 8"  (1.727 m)   Wt  165 lb 12.8 oz (75.2 kg)   SpO2 98%   BMI 25.21 kg/m   Wt Readings from Last 3 Encounters:  07/09/22 165 lb 12.8 oz (75.2 kg)  02/09/22 162 lb (73.5 kg)  01/08/22 164 lb 9.6 oz (74.7 kg)    Physical Exam Vitals and nursing note reviewed.  Constitutional:      General: She is not in acute distress.    Appearance: She is well-developed. She is not diaphoretic.  Eyes:     Conjunctiva/sclera: Conjunctivae normal.  Cardiovascular:     Rate and Rhythm: Normal rate and regular rhythm.     Heart sounds: Normal heart sounds. No murmur heard. Pulmonary:     Effort: Pulmonary effort is normal. No  respiratory distress.     Breath sounds: Normal breath sounds. No wheezing.  Musculoskeletal:        General: No swelling or tenderness. Normal range of motion.  Skin:    General: Skin is warm and dry.     Findings: No rash.  Neurological:     Mental Status: She is alert and oriented to person, place, and time.     Coordination: Coordination normal.  Psychiatric:        Behavior: Behavior normal.     Results for orders placed or performed in visit on 07/03/22  Bayer DCA Hb A1c Waived  Result Value Ref Range   HB A1C (BAYER DCA - WAIVED) 6.1 (H) 4.8 - 5.6 %  TSH  Result Value Ref Range   TSH 1.530 0.450 - 4.500 uIU/mL  CBC with Differential/Platelet  Result Value Ref Range   WBC 5.2 3.4 - 10.8 x10E3/uL   RBC 4.08 3.77 - 5.28 x10E6/uL   Hemoglobin 12.5 11.1 - 15.9 g/dL   Hematocrit 19.1 47.8 - 46.6 %   MCV 90 79 - 97 fL   MCH 30.6 26.6 - 33.0 pg   MCHC 34.2 31.5 - 35.7 g/dL   RDW 29.5 62.1 - 30.8 %   Platelets 235 150 - 450 x10E3/uL   Neutrophils 55 Not Estab. %   Lymphs 34 Not Estab. %   Monocytes 9 Not Estab. %   Eos 2 Not Estab. %   Basos 0 Not Estab. %   Neutrophils Absolute 2.9 1.4 - 7.0 x10E3/uL   Lymphocytes Absolute 1.7 0.7 - 3.1 x10E3/uL   Monocytes Absolute 0.5 0.1 - 0.9 x10E3/uL   EOS (ABSOLUTE) 0.1 0.0 - 0.4 x10E3/uL   Basophils Absolute 0.0 0.0 - 0.2 x10E3/uL   Immature Granulocytes 0 Not Estab. %   Immature Grans (Abs) 0.0 0.0 - 0.1 x10E3/uL  CMP14+EGFR  Result Value Ref Range   Glucose 96 70 - 99 mg/dL   BUN 16 8 - 27 mg/dL   Creatinine, Ser 6.57 (L) 0.57 - 1.00 mg/dL   eGFR 96 >84 ON/GEX/5.28   BUN/Creatinine Ratio 30 (H) 12 - 28   Sodium 141 134 - 144 mmol/L   Potassium 3.9 3.5 - 5.2 mmol/L   Chloride 105 96 - 106 mmol/L   CO2 22 20 - 29 mmol/L   Calcium 9.5 8.7 - 10.3 mg/dL   Total Protein 6.7 6.0 - 8.5 g/dL   Albumin 3.9 3.8 - 4.8 g/dL   Globulin, Total 2.8 1.5 - 4.5 g/dL   Albumin/Globulin Ratio 1.4 1.2 - 2.2   Bilirubin Total 0.3 0.0 -  1.2 mg/dL   Alkaline Phosphatase 55 44 - 121 IU/L   AST 20 0 - 40 IU/L   ALT 12 0 -  32 IU/L  Lipid panel  Result Value Ref Range   Cholesterol, Total 170 100 - 199 mg/dL   Triglycerides 99 0 - 149 mg/dL   HDL 64 >16 mg/dL   VLDL Cholesterol Cal 18 5 - 40 mg/dL   LDL Chol Calc (NIH) 88 0 - 99 mg/dL   Chol/HDL Ratio 2.7 0.0 - 4.4 ratio  VITAMIN D 25 Hydroxy (Vit-D Deficiency, Fractures)  Result Value Ref Range   Vit D, 25-Hydroxy 48.6 30.0 - 100.0 ng/mL    Assessment & Plan:   Problem List Items Addressed This Visit       Cardiovascular and Mediastinum   Hypertension   Relevant Medications   hydrochlorothiazide (HYDRODIURIL) 12.5 MG tablet     Endocrine   Hypothyroidism     Other   Prediabetes - Primary    Blood work looks really good, A1c was 6.1.  Blood pressure looks good to as well.  No changes, continue with activity and diet Follow up plan: Return in about 6 months (around 01/09/2023), or if symptoms worsen or fail to improve, for Hypertension and prediabetes and thyroid.  Counseling provided for all of the vaccine components No orders of the defined types were placed in this encounter.   Arville Care, MD [facility] 07/09/2022, 10:30 AM

## 2022-10-01 DIAGNOSIS — M5136 Other intervertebral disc degeneration, lumbar region: Secondary | ICD-10-CM | POA: Diagnosis not present

## 2022-11-29 DIAGNOSIS — M5412 Radiculopathy, cervical region: Secondary | ICD-10-CM | POA: Diagnosis not present

## 2023-01-02 ENCOUNTER — Other Ambulatory Visit: Payer: Medicare HMO

## 2023-01-02 DIAGNOSIS — R7303 Prediabetes: Secondary | ICD-10-CM

## 2023-01-02 DIAGNOSIS — E039 Hypothyroidism, unspecified: Secondary | ICD-10-CM | POA: Diagnosis not present

## 2023-01-02 DIAGNOSIS — I10 Essential (primary) hypertension: Secondary | ICD-10-CM

## 2023-01-02 LAB — BAYER DCA HB A1C WAIVED: HB A1C (BAYER DCA - WAIVED): 6.1 % — ABNORMAL HIGH (ref 4.8–5.6)

## 2023-01-03 LAB — LIPID PANEL
Chol/HDL Ratio: 2.7 ratio (ref 0.0–4.4)
Cholesterol, Total: 192 mg/dL (ref 100–199)
HDL: 71 mg/dL (ref >39)
LDL Chol Calc (NIH): 106 mg/dL — ABNORMAL HIGH (ref 0–99)
Triglycerides: 85 mg/dL (ref 0–149)
VLDL Cholesterol Cal: 15 mg/dL (ref 5–40)

## 2023-01-03 LAB — CBC WITH DIFFERENTIAL/PLATELET
Basophils Absolute: 0 10*3/uL (ref 0.0–0.2)
Basos: 1 %
EOS (ABSOLUTE): 0.1 10*3/uL (ref 0.0–0.4)
Eos: 2 %
Hematocrit: 40.7 % (ref 34.0–46.6)
Hemoglobin: 13.2 g/dL (ref 11.1–15.9)
Immature Grans (Abs): 0 10*3/uL (ref 0.0–0.1)
Immature Granulocytes: 0 %
Lymphocytes Absolute: 2.2 10*3/uL (ref 0.7–3.1)
Lymphs: 52 %
MCH: 30.4 pg (ref 26.6–33.0)
MCHC: 32.4 g/dL (ref 31.5–35.7)
MCV: 94 fL (ref 79–97)
Monocytes Absolute: 0.5 10*3/uL (ref 0.1–0.9)
Monocytes: 13 %
Neutrophils Absolute: 1.4 10*3/uL (ref 1.4–7.0)
Neutrophils: 32 %
Platelets: 249 10*3/uL (ref 150–450)
RBC: 4.34 x10E6/uL (ref 3.77–5.28)
RDW: 12.5 % (ref 11.7–15.4)
WBC: 4.3 10*3/uL (ref 3.4–10.8)

## 2023-01-03 LAB — CMP14+EGFR
ALT: 11 IU/L (ref 0–32)
AST: 19 [IU]/L (ref 0–40)
Albumin: 4.2 g/dL (ref 3.8–4.8)
Alkaline Phosphatase: 68 [IU]/L (ref 44–121)
BUN/Creatinine Ratio: 20 (ref 12–28)
BUN: 13 mg/dL (ref 8–27)
Bilirubin Total: 0.5 mg/dL (ref 0.0–1.2)
CO2: 22 mmol/L (ref 20–29)
Calcium: 9.7 mg/dL (ref 8.7–10.3)
Chloride: 104 mmol/L (ref 96–106)
Creatinine, Ser: 0.66 mg/dL (ref 0.57–1.00)
Globulin, Total: 3 g/dL (ref 1.5–4.5)
Glucose: 98 mg/dL (ref 70–99)
Potassium: 3.8 mmol/L (ref 3.5–5.2)
Sodium: 142 mmol/L (ref 134–144)
Total Protein: 7.2 g/dL (ref 6.0–8.5)
eGFR: 91 mL/min/{1.73_m2} (ref >59)

## 2023-01-03 LAB — TSH: TSH: 1.91 u[IU]/mL (ref 0.450–4.500)

## 2023-01-09 ENCOUNTER — Telehealth: Payer: Self-pay | Admitting: Family Medicine

## 2023-01-09 ENCOUNTER — Encounter: Payer: Self-pay | Admitting: Family Medicine

## 2023-01-09 ENCOUNTER — Ambulatory Visit: Payer: Medicare HMO | Admitting: Family Medicine

## 2023-01-09 VITALS — BP 136/79 | HR 77 | Ht 68.0 in | Wt 162.0 lb

## 2023-01-09 DIAGNOSIS — E039 Hypothyroidism, unspecified: Secondary | ICD-10-CM

## 2023-01-09 DIAGNOSIS — R7303 Prediabetes: Secondary | ICD-10-CM

## 2023-01-09 DIAGNOSIS — E7800 Pure hypercholesterolemia, unspecified: Secondary | ICD-10-CM

## 2023-01-09 DIAGNOSIS — I10 Essential (primary) hypertension: Secondary | ICD-10-CM | POA: Diagnosis not present

## 2023-01-09 DIAGNOSIS — E785 Hyperlipidemia, unspecified: Secondary | ICD-10-CM | POA: Insufficient documentation

## 2023-01-09 MED ORDER — LEVOTHYROXINE SODIUM 50 MCG PO TABS: 50.0000 ug | ORAL_TABLET | Freq: Every day | ORAL | 3 refills | Status: DC

## 2023-01-09 MED ORDER — HYDROCHLOROTHIAZIDE 12.5 MG PO TABS: 12.5000 mg | ORAL_TABLET | Freq: Every day | ORAL | 3 refills | Status: DC

## 2023-01-09 NOTE — Telephone Encounter (Signed)
Future orders placed 

## 2023-01-09 NOTE — Progress Notes (Signed)
BP 136/79   Pulse 77   Ht 5\' 8"  (1.727 m)   Wt 162 lb (73.5 kg)   SpO2 97%   BMI 24.63 kg/m    Subjective:   Patient ID: Theresa Gamble, female    DOB: 1945/11/18, 77 y.o.   MRN: [id number]  HPI: Theresa Gamble is a 77 y.o. female presenting on 01/09/2023 for Medical Management of Chronic Issues, Hypertension, and Hypothyroidism   HPI Hypothyroidism recheck Patient is coming in for thyroid recheck today as well. They deny any issues with hair changes or heat or cold problems or diarrhea or constipation. They deny any chest pain or palpitations. They are currently on levothyroxine 50 micrograms   Hypertension Patient is currently on hydrochlorothiazide, and their blood pressure today is 136/79. Patient denies any lightheadedness or dizziness. Patient denies headaches, blurred vision, chest pains, shortness of breath, or weakness. Denies any side effects from medication and is content with current medication.   Prediabetes Patient comes in today for recheck of his diabetes. Patient has been currently taking no medicine, diet control. Patient is not currently on an ACE inhibitor/ARB. Patient has seen an ophthalmologist this year. Patient denies any new issues with their feet. The symptom started onset as an adult hyperlipidemia and hypertension and hypothyroidism ARE RELATED TO DM   Hyperlipidemia Patient is coming in for recheck of his hyperlipidemia. The patient is currently taking no medicine, trying diet control. They deny any issues with myalgias or history of liver damage from it. They deny any focal numbness or weakness or chest pain.   Relevant past medical, surgical, family and social history reviewed and updated as indicated. Interim medical history since our last visit reviewed. Allergies and medications reviewed and updated.  Review of Systems  Constitutional:  Negative for chills and fever.  HENT:  Negative for congestion, ear discharge and ear pain.   Eyes:  Negative for  redness and visual disturbance.  Respiratory:  Negative for chest tightness and shortness of breath.   Cardiovascular:  Negative for chest pain and leg swelling.  Genitourinary:  Negative for difficulty urinating and dysuria.  Musculoskeletal:  Negative for back pain and gait problem.  Skin:  Negative for rash.  Neurological:  Negative for light-headedness and headaches.  Psychiatric/Behavioral:  Negative for agitation and behavioral problems.   All other systems reviewed and are negative.   Per HPI unless specifically indicated above   Allergies as of 01/09/2023       Reactions   Latex Rash        Medication List        Accurate as of January 09, 2023  4:47 PM. If you have any questions, ask your nurse or doctor.          STOP taking these medications    diclofenac Sodium 1 % Gel Commonly known as: Voltaren Stopped by: Elige Radon Loukas Antonson       TAKE these medications    aspirin EC 81 MG tablet Take 81 mg by mouth daily.   calcium carbonate 1500 (600 Ca) MG Tabs tablet Commonly known as: OSCAL Take 600 mg of elemental calcium by mouth daily with breakfast.   hydrochlorothiazide 12.5 MG tablet Commonly known as: HYDRODIURIL Take 1 tablet (12.5 mg total) by mouth daily.   levothyroxine 50 MCG tablet Commonly known as: SYNTHROID Take 1 tablet (50 mcg total) by mouth daily.   multivitamin tablet Take 1 tablet by mouth daily.   Ultram 50 MG tablet Generic drug: traMADol Take  1 tablet 3 times a day by oral route as needed for pain.         Objective:   BP 136/79   Pulse 77   Ht 5\' 8"  (1.727 m)   Wt 162 lb (73.5 kg)   SpO2 97%   BMI 24.63 kg/m   Wt Readings from Last 3 Encounters:  01/09/23 162 lb (73.5 kg)  07/09/22 165 lb 12.8 oz (75.2 kg)  02/09/22 162 lb (73.5 kg)    Physical Exam Vitals and nursing note reviewed.  Constitutional:      General: She is not in acute distress.    Appearance: She is well-developed. She is not  diaphoretic.  Eyes:     Conjunctiva/sclera: Conjunctivae normal.  Cardiovascular:     Rate and Rhythm: Normal rate and regular rhythm.     Heart sounds: Normal heart sounds. No murmur heard. Pulmonary:     Effort: Pulmonary effort is normal. No respiratory distress.     Breath sounds: Normal breath sounds. No wheezing.  Musculoskeletal:        General: No swelling or tenderness. Normal range of motion.  Skin:    General: Skin is warm and dry.     Findings: No rash.  Neurological:     Mental Status: She is alert and oriented to person, place, and time.     Coordination: Coordination normal.  Psychiatric:        Behavior: Behavior normal.     Results for orders placed or performed in visit on 01/02/23  CBC with Differential/Platelet  Result Value Ref Range   WBC 4.3 3.4 - 10.8 x10E3/uL   RBC 4.34 3.77 - 5.28 x10E6/uL   Hemoglobin 13.2 11.1 - 15.9 g/dL   Hematocrit 56.2 13.0 - 46.6 %   MCV 94 79 - 97 fL   MCH 30.4 26.6 - 33.0 pg   MCHC 32.4 31.5 - 35.7 g/dL   RDW 86.5 78.4 - 69.6 %   Platelets 249 150 - 450 x10E3/uL   Neutrophils 32 Not Estab. %   Lymphs 52 Not Estab. %   Monocytes 13 Not Estab. %   Eos 2 Not Estab. %   Basos 1 Not Estab. %   Neutrophils Absolute 1.4 1.4 - 7.0 x10E3/uL   Lymphocytes Absolute 2.2 0.7 - 3.1 x10E3/uL   Monocytes Absolute 0.5 0.1 - 0.9 x10E3/uL   EOS (ABSOLUTE) 0.1 0.0 - 0.4 x10E3/uL   Basophils Absolute 0.0 0.0 - 0.2 x10E3/uL   Immature Granulocytes 0 Not Estab. %   Immature Grans (Abs) 0.0 0.0 - 0.1 x10E3/uL  CMP14+EGFR  Result Value Ref Range   Glucose 98 70 - 99 mg/dL   BUN 13 8 - 27 mg/dL   Creatinine, Ser 2.95 0.57 - 1.00 mg/dL   eGFR 91 >28 UX/LKG/4.01   BUN/Creatinine Ratio 20 12 - 28   Sodium 142 134 - 144 mmol/L   Potassium 3.8 3.5 - 5.2 mmol/L   Chloride 104 96 - 106 mmol/L   CO2 22 20 - 29 mmol/L   Calcium 9.7 8.7 - 10.3 mg/dL   Total Protein 7.2 6.0 - 8.5 g/dL   Albumin 4.2 3.8 - 4.8 g/dL   Globulin, Total 3.0 1.5 -  4.5 g/dL   Bilirubin Total 0.5 0.0 - 1.2 mg/dL   Alkaline Phosphatase 68 44 - 121 IU/L   AST 19 0 - 40 IU/L   ALT 11 0 - 32 IU/L  Lipid panel  Result Value Ref Range   Cholesterol,  Total 192 100 - 199 mg/dL   Triglycerides 85 0 - 149 mg/dL   HDL 71 >64 mg/dL   VLDL Cholesterol Cal 15 5 - 40 mg/dL   LDL Chol Calc (NIH) 403 (H) 0 - 99 mg/dL   Chol/HDL Ratio 2.7 0.0 - 4.4 ratio  TSH  Result Value Ref Range   TSH 1.910 0.450 - 4.500 uIU/mL  Bayer DCA Hb A1c Waived  Result Value Ref Range   HB A1C (BAYER DCA - WAIVED) 6.1 (H) 4.8 - 5.6 %    Assessment & Plan:   Problem List Items Addressed This Visit       Cardiovascular and Mediastinum   Hypertension   Relevant Medications   hydrochlorothiazide (HYDRODIURIL) 12.5 MG tablet   Other Relevant Orders   Bayer DCA Hb A1c Waived   CBC with Differential/Platelet   CMP14+EGFR     Endocrine   Hypothyroidism   Relevant Medications   levothyroxine (SYNTHROID) 50 MCG tablet   Other Relevant Orders   TSH     Other   Prediabetes - Primary   Relevant Orders   Bayer DCA Hb A1c Waived   Hyperlipidemia   Relevant Medications   hydrochlorothiazide (HYDRODIURIL) 12.5 MG tablet   Other Relevant Orders   Lipid panel    A1c was 6.1 which has been stable, continue to focus on diet.  LDL cholesterol slightly more elevated at 101, before it been in the 80s, focus on diet as well. Follow up plan: Return in about 6 months (around 07/09/2023), or if symptoms worsen or fail to improve, for Thyroid and hypertension and cholesterol and prediabetes.  Counseling provided for all of the vaccine components Orders Placed This Encounter  Procedures   Bayer DCA Hb A1c Waived   CBC with Differential/Platelet   CMP14+EGFR   Lipid panel   TSH    Arville Care, MD [facility] 01/09/2023, 4:47 PM

## 2023-01-15 ENCOUNTER — Other Ambulatory Visit: Payer: Self-pay | Admitting: Family Medicine

## 2023-01-15 DIAGNOSIS — Z1231 Encounter for screening mammogram for malignant neoplasm of breast: Secondary | ICD-10-CM

## 2023-01-24 ENCOUNTER — Telehealth: Payer: Medicare HMO | Admitting: Physician Assistant

## 2023-01-24 DIAGNOSIS — B9689 Other specified bacterial agents as the cause of diseases classified elsewhere: Secondary | ICD-10-CM | POA: Diagnosis not present

## 2023-01-24 DIAGNOSIS — J019 Acute sinusitis, unspecified: Secondary | ICD-10-CM | POA: Diagnosis not present

## 2023-01-24 MED ORDER — AMOXICILLIN-POT CLAVULANATE 875-125 MG PO TABS: 1.0000 | ORAL_TABLET | Freq: Two times a day (BID) | ORAL | 0 refills | Status: DC

## 2023-01-24 NOTE — Progress Notes (Signed)

## 2023-01-25 ENCOUNTER — Encounter: Payer: Self-pay | Admitting: Family Medicine

## 2023-01-28 ENCOUNTER — Ambulatory Visit
Admission: RE | Admit: 2023-01-28 | Discharge: 2023-01-28 | Disposition: A | Discharge status: Home / Self Care | Payer: Medicare HMO | Source: Ambulatory Visit | Attending: Family Medicine

## 2023-01-28 DIAGNOSIS — Z1231 Encounter for screening mammogram for malignant neoplasm of breast: Secondary | ICD-10-CM | POA: Diagnosis not present

## 2023-01-31 DIAGNOSIS — M51362 Other intervertebral disc degeneration, lumbar region with discogenic back pain and lower extremity pain: Secondary | ICD-10-CM | POA: Diagnosis not present

## 2023-01-31 DIAGNOSIS — M5416 Radiculopathy, lumbar region: Secondary | ICD-10-CM | POA: Diagnosis not present

## 2023-01-31 DIAGNOSIS — M47896 Other spondylosis, lumbar region: Secondary | ICD-10-CM | POA: Diagnosis not present

## 2023-01-31 DIAGNOSIS — M5412 Radiculopathy, cervical region: Secondary | ICD-10-CM | POA: Diagnosis not present

## 2023-02-27 ENCOUNTER — Ambulatory Visit (INDEPENDENT_AMBULATORY_CARE_PROVIDER_SITE_OTHER): Payer: PPO

## 2023-02-27 VITALS — Ht 68.0 in | Wt 162.0 lb

## 2023-02-27 DIAGNOSIS — Z0000 Encounter for general adult medical examination without abnormal findings: Secondary | ICD-10-CM | POA: Diagnosis not present

## 2023-02-27 NOTE — Progress Notes (Signed)
 Subjective:   Theresa Gamble is a 78 y.o. female who presents for Medicare Annual (Subsequent) preventive examination.  Visit Complete: Virtual I connected with  [name] on 02/27/23 by a audio enabled telemedicine application and verified that I am speaking with the correct person using two identifiers.  Patient Location: Home  Provider Location: Home Office  I discussed the limitations of evaluation and management by telemedicine. The patient expressed understanding and agreed to proceed.  Vital Signs: Because this visit was a virtual/telehealth visit, some criteria may be missing or patient reported. Any vitals not documented were not able to be obtained and vitals that have been documented are patient reported.  Cardiac Risk Factors include: advanced age (>49men, >67 women);hypertension     Objective:    Today's Vitals   02/27/23 0806  Weight: 162 lb (73.5 kg)  Height: 5' 8 (1.727 m)   Body mass index is 24.63 kg/m.     02/27/2023    8:34 AM 02/09/2022    8:38 AM 02/08/2021    8:21 AM 02/08/2020    8:43 AM 01/07/2020    5:48 PM 02/04/2019    8:34 AM  Advanced Directives  Does Patient Have a Medical Advance Directive? No No No No No No  Would patient like information on creating a medical advance directive? Yes (MAU/Ambulatory/Procedural Areas - Information given) No - Patient declined No - Patient declined No - Patient declined  No - Patient declined    Current Medications (verified) Outpatient Encounter Medications as of 02/27/2023  Medication Sig   aspirin EC 81 MG tablet Take 81 mg by mouth daily.   calcium carbonate (OSCAL) 1500 (600 Ca) MG TABS tablet Take 600 mg of elemental calcium by mouth daily with breakfast.   hydrochlorothiazide  (HYDRODIURIL ) 12.5 MG tablet Take 1 tablet (12.5 mg total) by mouth daily.   levothyroxine  (SYNTHROID ) 50 MCG tablet Take 1 tablet (50 mcg total) by mouth daily.   Multiple Vitamin (MULTIVITAMIN) tablet Take 1 tablet by mouth  daily.   ULTRAM 50 MG tablet Take 1 tablet 3 times a day by oral route as needed for pain.   [DISCONTINUED] amoxicillin -clavulanate (AUGMENTIN ) 875-125 MG tablet Take 1 tablet by mouth 2 (two) times daily.   No facility-administered encounter medications on file as of 02/27/2023.    Allergies (verified) Latex   History: Past Medical History:  Diagnosis Date   Allergy    seasonal allergies   Arthritis    back   Cataract    bilateral sx   Hypertension    Past Surgical History:  Procedure Laterality Date   ABDOMINAL HYSTERECTOMY     BREAST BIOPSY     CHOLECYSTECTOMY     EYE SURGERY Bilateral    cataract removal   Family History  Problem Relation Age of Onset   Heart disease Mother    Diabetes Mother    Stroke Father    Colon cancer Neg Hx    Colon polyps Neg Hx    Esophageal cancer Neg Hx    Rectal cancer Neg Hx    Stomach cancer Neg Hx    Breast cancer Neg Hx    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  Tobacco Use   Smoking status: Never   Smokeless tobacco: Never  Vaping Use   Vaping status: Never Used  Substance and Sexual Activity   Alcohol use: Never   Drug use: Never   Sexual activity: Yes    Birth control/protection: Surgical  Other Topics Concern   Not on file  Social History Narrative   Not on file   Social Drivers of Health   Financial Resource Strain: Low Risk  (02/27/2023)   Overall Financial Resource Strain (CARDIA)    Difficulty of Paying Living Expenses: Not hard at all  Food Insecurity: No Food Insecurity (02/27/2023)   Hunger Vital Sign    Worried About Running Out of Food in the Last Year: Never true    Ran Out of Food in the Last Year: Never true  Transportation Needs: No Transportation  Needs (02/27/2023)   PRAPARE - Administrator, Civil Service (Medical): No    Lack of Transportation (Non-Medical): No  Physical Activity: Insufficiently Active (02/27/2023)   Exercise Vital Sign    Days of Exercise per Week: 3 days    Minutes of Exercise per Session: 30 min  Stress: No Stress Concern Present (02/27/2023)   [employer] of Occupational Health - Occupational Stress Questionnaire    Feeling of Stress : Not at all  Social Connections: Moderately Integrated (02/27/2023)   Social Connection and Isolation Panel [NHANES]    Frequency of Communication with Friends and Family: More than three times a week    Frequency of Social Gatherings with Friends and Family: Three times a week    Attends Religious Services: More than 4 times per year    Active Member of Clubs or Organizations: No    Attends Banker Meetings: Never    Marital Status: Married    Tobacco Counseling Counseling given: Not Answered   Clinical Intake:  Pre-visit preparation completed: Yes  Pain : No/denies pain     Diabetes: No  How often do you need to have someone help you when you read instructions, pamphlets, or other written materials from your doctor or pharmacy?: 1 - Never  Interpreter Needed?: No  Information entered by :: Charmaine Bloodgood LPN   Activities of Daily Living    02/27/2023    8:33 AM  In your present state of health, do you have any difficulty performing the following activities:  Hearing? 0  Vision? 0  Difficulty concentrating or making decisions? 0  Walking or climbing stairs? 0  Dressing or bathing? 0  Doing errands, shopping? 0  Preparing Food and eating ? N  Using the Toilet? N  In the past six months, have you accidently leaked urine? N  Do you have problems with loss of bowel control? N  Managing your Medications? N  Managing your Finances? N  Housekeeping or managing your Housekeeping? N    Patient Care Team: Dettinger, Fonda LABOR, MD as  PCP - General (Family Medicine) Bonner Ade, MD as Consulting Physician (Physical Medicine and Rehabilitation) [facility]  Indicate any recent Medical Services you may have received from other than [facility] providers in the past year (date may be approximate).     Assessment:   This is a routine wellness examination for [place].  Hearing/Vision screen Hearing Screening - Comments:: Denies hearing difficulties   Vision Screening - Comments:: Wears rx glasses - up to date with routine eye exams with [facility]      Goals Addressed  This Visit's Progress    COMPLETED: DIET - INCREASE WATER INTAKE       Try to drink 6-8 glasses of water daily     DIET - INCREASE WATER INTAKE       COMPLETED: Patient Stated       Continue PT on lower back and see what next steps are for getting pain free. -01/2020 Now getting injections with Dr Bonner. - 01/2021     Remain active and independent        Depression Screen    02/27/2023    8:34 AM 01/09/2023    9:56 AM 07/09/2022   10:02 AM 02/09/2022    8:36 AM 01/08/2022    9:54 AM 07/07/2021    9:59 AM 04/11/2021    8:49 AM  PHQ 2/9 Scores  PHQ - 2 Score 0 0 0 0 0 0 0  PHQ- 9 Score 0  0 0 0 0 0    Fall Risk    02/27/2023    8:33 AM 01/09/2023    9:56 AM 07/09/2022   10:02 AM 02/09/2022    8:34 AM 01/08/2022    9:54 AM  Fall Risk   Falls in the past year? 0 0 0 0 0  Number falls in past yr: 0  0 0   Injury with Fall? 0  0 0   Risk for fall due to : No Fall Risks  No Fall Risks No Fall Risks   Follow up Falls prevention discussed;Education provided;Falls evaluation completed  Falls evaluation completed Falls prevention discussed     MEDICARE RISK AT HOME: Medicare Risk at Home Any stairs in or around the home?: No If so, are there any without handrails?: No Home free of loose throw rugs in walkways, pet beds, electrical cords, etc?: Yes Adequate lighting in your home to reduce risk of  falls?: Yes Life alert?: No Use of a cane, walker or w/c?: No Grab bars in the bathroom?: Yes Shower chair or bench in shower?: No Elevated toilet seat or a handicapped toilet?: Yes  TIMED UP AND GO:  Was the test performed?  No    Cognitive Function:        02/27/2023    8:34 AM 02/09/2022    8:39 AM 02/08/2020    9:10 AM 02/04/2019    8:38 AM  6CIT Screen  What Year? 0 points 0 points 0 points 0 points  What month? 0 points 0 points 0 points 0 points  What time? 0 points 0 points 0 points 0 points  Count back from 20 0 points 0 points 0 points 0 points  Months in reverse 0 points 0 points 0 points 0 points  Repeat phrase 0 points 0 points 0 points 0 points  Total Score 0 points 0 points 0 points 0 points    Immunizations Immunization History  Administered Date(s) Administered   Fluad Quad(high Dose 65+) 11/23/2021   Influenza, High Dose Seasonal PF 12/06/2014, 11/30/2015, 11/19/2017, 10/28/2018   Influenza-Unspecified 12/01/2013, 11/20/2016, 11/22/2019, 12/06/2020, 11/26/2022   PFIZER(Purple Top)SARS-COV-2 Vaccination 03/08/2019, 03/26/2019, 11/05/2019, 06/30/2020   Pfizer Covid-19 Vaccine Bivalent Booster 9yrs & up 01/19/2021   Pneumococcal Conjugate-13 02/17/2015, 12/18/2016   Pneumococcal Polysaccharide-23 12/01/2013, 10/28/2018   RSV,unspecified 11/26/2022   Tdap 12/18/2016, 01/01/2023   Zoster Recombinant(Shingrix) 06/11/2017, 08/13/2017   Zoster, Live 12/05/2013    TDAP status: Up to date  Flu Vaccine status: Up to date  Pneumococcal vaccine status: Up to date  Covid-19 vaccine status: Information  provided on how to obtain vaccines.   Qualifies for Shingles Vaccine? Yes   Zostavax completed Yes   Shingrix Completed?: Yes  Screening Tests Health Maintenance  Topic Date Due   COVID-19 Vaccine (6 - 2024-25 season) 10/21/2022   Medicare Annual Wellness (AWV)  02/27/2024   Colonoscopy  03/24/2030   DTaP/Tdap/Td (3 - Td or Tdap) 12/31/2032   Pneumonia  Vaccine 23+ Years old  Completed   INFLUENZA VACCINE  Completed   DEXA SCAN  Completed   Zoster Vaccines- Shingrix  Completed   HPV VACCINES  Aged Out   Hepatitis C Screening  Discontinued    Health Maintenance  Health Maintenance Due  Topic Date Due   COVID-19 Vaccine (6 - 2024-25 season) 10/21/2022    Colorectal cancer screening: Type of screening: Colonoscopy. Completed 03/24/20. Repeat every 10 years  Mammogram status: Completed 01/28/23. Repeat every year  Bone Density status: Completed 01/04/20. Results reflect: Bone density results: OSTEOPENIA. Repeat every 2-3 years.  Lung Cancer Screening: (Low Dose CT Chest recommended if Age 54-80 years, 20 pack-year currently smoking OR have quit w/in 15years.) does not qualify.   Lung Cancer Screening Referral: n/a  Additional Screening:  Hepatitis C Screening: does qualify  Vision Screening: Recommended annual ophthalmology exams for early detection of glaucoma and other disorders of the eye. Is the patient up to date with their annual eye exam?  Yes  Who is the provider or what is the name of the office in which the patient attends annual eye exams? [facility] If pt is not established with a provider, would they like to be referred to a provider to establish care? No .   Dental Screening: Recommended annual dental exams for proper oral hygiene  Community Resource Referral / Chronic Care Management: CRR required this visit?  No   CCM required this visit?  No     Plan:     I have personally reviewed and noted the following in the patient's chart:   Medical and social history Use of alcohol, tobacco or illicit drugs  Current medications and supplements including opioid prescriptions. Patient is not currently taking opioid prescriptions. Functional ability and status Nutritional status Physical activity Advanced directives List of other physicians Hospitalizations, surgeries, and ER visits in previous 12  months Vitals Screenings to include cognitive, depression, and falls Referrals and appointments  In addition, I have reviewed and discussed with patient certain preventive protocols, quality metrics, and best practice recommendations. A written personalized care plan for preventive services as well as general preventive health recommendations were provided to patient.     Lavelle Pfeiffer [place], [place]   09/19/7972   After Visit Summary: (MyChart) Due to this being a telephonic visit, the after visit summary with patients personalized plan was offered to patient via MyChart   Nurse Notes: No concerns at this time

## 2023-02-27 NOTE — Patient Instructions (Signed)
 [name] , Thank you for taking time to come for your Medicare Wellness Visit. I appreciate your ongoing commitment to your health goals. Please review the following plan we discussed and let me know if I can assist you in the future.   Referrals/Orders/Follow-Ups/Clinician Recommendations: Aim for 30 minutes of exercise or brisk walking, 6-8 glasses of water, and 5 servings of fruits and vegetables each day.  This is a list of the screening recommended for you and due dates:  Health Maintenance  Topic Date Due   COVID-19 Vaccine (6 - 2024-25 season) 10/21/2022   Medicare Annual Wellness Visit  02/27/2024   Colon Cancer Screening  03/24/2030   DTaP/Tdap/Td vaccine (3 - Td or Tdap) 12/31/2032   Pneumonia Vaccine  Completed   Flu Shot  Completed   DEXA scan (bone density measurement)  Completed   Zoster (Shingles) Vaccine  Completed   HPV Vaccine  Aged Out   Hepatitis C Screening  Discontinued    Advanced directives: (ACP Link)Information on Advanced Care Planning can be found at [place]  Secretary of [facility] Advance Health Care Directives Advance Health Care Directives ([email or web address]   Next Medicare Annual Wellness Visit scheduled for next year: Yes

## 2023-04-25 ENCOUNTER — Ambulatory Visit: Admitting: Family Medicine

## 2023-04-25 ENCOUNTER — Encounter: Payer: Self-pay | Admitting: Family Medicine

## 2023-04-25 VITALS — BP 133/74 | HR 91 | Temp 97.5°F | Ht 68.0 in | Wt 161.0 lb

## 2023-04-25 DIAGNOSIS — R300 Dysuria: Secondary | ICD-10-CM

## 2023-04-25 LAB — URINALYSIS, ROUTINE W REFLEX MICROSCOPIC
Bilirubin, UA: NEGATIVE
Glucose, UA: NEGATIVE
Ketones, UA: NEGATIVE
Nitrite, UA: NEGATIVE
RBC, UA: NEGATIVE
Specific Gravity, UA: 1.025 (ref 1.005–1.030)
Urobilinogen, Ur: 0.2 mg/dL (ref 0.2–1.0)
pH, UA: 6.5 (ref 5.0–7.5)

## 2023-04-25 LAB — MICROSCOPIC EXAMINATION
Epithelial Cells (non renal): NONE SEEN /HPF (ref 0–10)
RBC, Urine: NONE SEEN /HPF (ref 0–2)
Renal Epithel, UA: NONE SEEN /HPF
Yeast, UA: NONE SEEN

## 2023-04-25 MED ORDER — CEPHALEXIN 500 MG PO CAPS: 500.0000 mg | ORAL_CAPSULE | Freq: Two times a day (BID) | ORAL | 0 refills | Status: AC

## 2023-04-25 NOTE — Progress Notes (Signed)
   Acute Office Visit  Subjective:     Patient ID: Theresa Gamble, female    DOB: 1945-06-22, 78 y.o.   MRN: [id number]  Chief Complaint  Patient presents with   Dysuria    Dysuria  This is a new problem. Episode onset: 2 days. The problem occurs every urination. The problem has been gradually worsening. The quality of the pain is described as burning. Associated symptoms include hesitancy and urgency. Pertinent negatives include no chills, discharge, flank pain, frequency, hematuria, nausea or vomiting. Associated symptoms comments: Foul odor. She has tried nothing for the symptoms. There is no history of recurrent UTIs.     Review of Systems  Constitutional:  Negative for chills.  Gastrointestinal:  Negative for nausea and vomiting.  Genitourinary:  Positive for dysuria, hesitancy and urgency. Negative for flank pain, frequency and hematuria.        Objective:    BP 133/74   Pulse 91   Temp (!) 97.5 F (36.4 C) (Temporal)   Ht 5\' 8"  (1.727 m)   Wt 161 lb (73 kg)   SpO2 98%   BMI 24.48 kg/m    Physical Exam Vitals and nursing note reviewed.  Constitutional:      General: She is not in acute distress.    Appearance: She is not ill-appearing, toxic-appearing or diaphoretic.  Cardiovascular:     Rate and Rhythm: Normal rate and regular rhythm.     Heart sounds: Normal heart sounds. No murmur heard. Pulmonary:     Effort: Pulmonary effort is normal. No respiratory distress.     Breath sounds: Normal breath sounds. No wheezing, rhonchi or rales.  Abdominal:     General: Bowel sounds are normal. There is no distension.     Palpations: Abdomen is soft.     Tenderness: There is no abdominal tenderness. There is no right CVA tenderness, left CVA tenderness, guarding or rebound.  Skin:    General: Skin is warm and dry.  Neurological:     General: No focal deficit present.     Mental Status: She is alert and oriented to person, place, and time.     Urine dipstick  shows positive for protein and positive for leukocytes.  Micro exam: 0-5 WBC's per HPF and many + bacteria.       Assessment & Plan:   [place] was seen today for dysuria.  Diagnoses and all orders for this visit:  Dysuria Will treat empirically based on symptoms pending urine cultures. Push hydration Return to office for new or worsening symptoms, or if symptoms persist.  -     Urinalysis, Routine w reflex microscopic -     Urine Culture -     cephALEXin (KEFLEX) 500 MG capsule; Take 1 capsule (500 mg total) by mouth 2 (two) times daily for 7 days.  The patient indicates understanding of these issues and agrees with the plan.  Gabriel Earing, FNP

## 2023-04-27 LAB — URINE CULTURE

## 2023-05-27 DIAGNOSIS — H903 Sensorineural hearing loss, bilateral: Secondary | ICD-10-CM | POA: Diagnosis not present

## 2023-06-03 DIAGNOSIS — M545 Low back pain, unspecified: Secondary | ICD-10-CM | POA: Diagnosis not present

## 2023-06-03 DIAGNOSIS — M5412 Radiculopathy, cervical region: Secondary | ICD-10-CM | POA: Diagnosis not present

## 2023-06-03 DIAGNOSIS — M51362 Other intervertebral disc degeneration, lumbar region with discogenic back pain and lower extremity pain: Secondary | ICD-10-CM | POA: Diagnosis not present

## 2023-06-03 DIAGNOSIS — M5416 Radiculopathy, lumbar region: Secondary | ICD-10-CM | POA: Diagnosis not present

## 2023-07-08 ENCOUNTER — Other Ambulatory Visit: Payer: Medicare HMO

## 2023-07-08 DIAGNOSIS — R7303 Prediabetes: Secondary | ICD-10-CM | POA: Diagnosis not present

## 2023-07-08 DIAGNOSIS — E7800 Pure hypercholesterolemia, unspecified: Secondary | ICD-10-CM | POA: Diagnosis not present

## 2023-07-08 DIAGNOSIS — I10 Essential (primary) hypertension: Secondary | ICD-10-CM | POA: Diagnosis not present

## 2023-07-08 DIAGNOSIS — E039 Hypothyroidism, unspecified: Secondary | ICD-10-CM | POA: Diagnosis not present

## 2023-07-08 LAB — LIPID PANEL

## 2023-07-08 LAB — BAYER DCA HB A1C WAIVED: HB A1C (BAYER DCA - WAIVED): 5.9 % — ABNORMAL HIGH (ref 4.8–5.6)

## 2023-07-09 LAB — CBC WITH DIFFERENTIAL/PLATELET
Basophils Absolute: 0 10*3/uL (ref 0.0–0.2)
Basos: 1 %
EOS (ABSOLUTE): 0.1 10*3/uL (ref 0.0–0.4)
Eos: 3 %
Hematocrit: 38.3 % (ref 34.0–46.6)
Hemoglobin: 12.4 g/dL (ref 11.1–15.9)
Immature Grans (Abs): 0 10*3/uL (ref 0.0–0.1)
Immature Granulocytes: 0 %
Lymphocytes Absolute: 1.5 10*3/uL (ref 0.7–3.1)
Lymphs: 46 %
MCH: 30.1 pg (ref 26.6–33.0)
MCHC: 32.4 g/dL (ref 31.5–35.7)
MCV: 93 fL (ref 79–97)
Monocytes Absolute: 0.4 10*3/uL (ref 0.1–0.9)
Monocytes: 12 %
Neutrophils Absolute: 1.2 10*3/uL — ABNORMAL LOW (ref 1.4–7.0)
Neutrophils: 38 %
Platelets: 202 10*3/uL (ref 150–450)
RBC: 4.12 x10E6/uL (ref 3.77–5.28)
RDW: 12.9 % (ref 11.7–15.4)
WBC: 3.2 10*3/uL — ABNORMAL LOW (ref 3.4–10.8)

## 2023-07-09 LAB — CMP14+EGFR
ALT: 11 IU/L (ref 0–32)
AST: 17 IU/L (ref 0–40)
Albumin: 3.8 g/dL (ref 3.8–4.8)
Alkaline Phosphatase: 59 IU/L (ref 44–121)
BUN/Creatinine Ratio: 24 (ref 12–28)
BUN: 14 mg/dL (ref 8–27)
Bilirubin Total: 0.4 mg/dL (ref 0.0–1.2)
CO2: 23 mmol/L (ref 20–29)
Calcium: 9.5 mg/dL (ref 8.7–10.3)
Chloride: 105 mmol/L (ref 96–106)
Creatinine, Ser: 0.58 mg/dL (ref 0.57–1.00)
Globulin, Total: 2.8 g/dL (ref 1.5–4.5)
Glucose: 94 mg/dL (ref 70–99)
Potassium: 3.7 mmol/L (ref 3.5–5.2)
Sodium: 141 mmol/L (ref 134–144)
Total Protein: 6.6 g/dL (ref 6.0–8.5)
eGFR: 94 mL/min/{1.73_m2} (ref >59)

## 2023-07-09 LAB — LIPID PANEL
Chol/HDL Ratio: 2.8 ratio (ref 0.0–4.4)
Cholesterol, Total: 165 mg/dL (ref 100–199)
HDL: 60 mg/dL (ref >39)
LDL Chol Calc (NIH): 92 mg/dL (ref 0–99)
Triglycerides: 67 mg/dL (ref 0–149)
VLDL Cholesterol Cal: 13 mg/dL (ref 5–40)

## 2023-07-09 LAB — TSH: TSH: 1.17 u[IU]/mL (ref 0.450–4.500)

## 2023-07-10 ENCOUNTER — Telehealth: Payer: Self-pay | Admitting: Family Medicine

## 2023-07-10 ENCOUNTER — Ambulatory Visit (INDEPENDENT_AMBULATORY_CARE_PROVIDER_SITE_OTHER): Payer: Medicare HMO | Admitting: Family Medicine

## 2023-07-10 ENCOUNTER — Encounter: Payer: Self-pay | Admitting: Family Medicine

## 2023-07-10 VITALS — BP 139/77 | HR 78 | Ht 68.0 in | Wt 161.0 lb

## 2023-07-10 DIAGNOSIS — E039 Hypothyroidism, unspecified: Secondary | ICD-10-CM | POA: Diagnosis not present

## 2023-07-10 DIAGNOSIS — R7303 Prediabetes: Secondary | ICD-10-CM | POA: Diagnosis not present

## 2023-07-10 DIAGNOSIS — E7800 Pure hypercholesterolemia, unspecified: Secondary | ICD-10-CM | POA: Diagnosis not present

## 2023-07-10 DIAGNOSIS — I10 Essential (primary) hypertension: Secondary | ICD-10-CM | POA: Diagnosis not present

## 2023-07-10 DIAGNOSIS — E041 Nontoxic single thyroid nodule: Secondary | ICD-10-CM

## 2023-07-10 NOTE — Progress Notes (Signed)
 BP 139/77   Pulse 78   Ht 5\' 8"  (1.727 m)   Wt 161 lb (73 kg)   SpO2 97%   BMI 24.48 kg/m    Subjective:   Patient ID: Theresa Gamble, female    DOB: May 21, 1945, 78 y.o.   MRN: [id number]  HPI: Theresa Gamble is a 78 y.o. female presenting on 07/10/2023 for Medical Management of Chronic Issues and Hypertension   HPI Hypertension Patient is currently on hydrochlorothiazide , and their blood pressure today is 139/77. Patient denies any lightheadedness or dizziness. Patient denies headaches, blurred vision, chest pains, shortness of breath, or weakness. Denies any side effects from medication and is content with current medication.   Prediabetes Patient comes in today for recheck of his diabetes. Patient has been currently taking no medication. Patient is not currently on an ACE inhibitor/ARB. Patient has not seen an ophthalmologist this year. Patient denies any new issues with their feet. The symptom started onset as an adult htn and hld ARE RELATED TO DM   Hypothyroidism recheck Patient is coming in for thyroid  recheck today as well. They deny any issues with hair changes or heat or cold problems or diarrhea or constipation. They deny any chest pain or palpitations. They are currently on levothyroxine  50 micrograms   Hyperlipidemia Patient is coming in for recheck of his hyperlipidemia. The patient is currently taking no medication currently, diet control. They deny any issues with myalgias or history of liver damage from it. They deny any focal numbness or weakness or chest pain.   Relevant past medical, surgical, family and social history reviewed and updated as indicated. Interim medical history since our last visit reviewed. Allergies and medications reviewed and updated.  Review of Systems  Constitutional:  Negative for chills and fever.  HENT:  Negative for congestion, ear discharge and ear pain.   Eyes:  Negative for visual disturbance.  Respiratory:  Negative for chest  tightness and shortness of breath.   Cardiovascular:  Negative for chest pain and leg swelling.  Genitourinary:  Negative for difficulty urinating and dysuria.  Musculoskeletal:  Negative for back pain and gait problem.  Skin:  Negative for rash.  Neurological:  Negative for dizziness, light-headedness and headaches.  Psychiatric/Behavioral:  Negative for agitation and behavioral problems.   All other systems reviewed and are negative.   Per HPI unless specifically indicated above   Allergies as of 07/10/2023       Reactions   Latex Rash        Medication List        Accurate as of Jul 10, 2023  8:46 AM. If you have any questions, ask your nurse or doctor.          aspirin EC 81 MG tablet Take 81 mg by mouth daily.   calcium carbonate 1500 (600 Ca) MG Tabs tablet Commonly known as: OSCAL Take 600 mg of elemental calcium by mouth daily with breakfast.   hydrochlorothiazide  12.5 MG tablet Commonly known as: HYDRODIURIL  Take 1 tablet (12.5 mg total) by mouth daily.   levothyroxine  50 MCG tablet Commonly known as: SYNTHROID  Take 1 tablet (50 mcg total) by mouth daily.   multivitamin tablet Take 1 tablet by mouth daily.   Ultram 50 MG tablet Generic drug: traMADol Take 1 tablet 3 times a day by oral route as needed for pain.         Objective:   BP 139/77   Pulse 78   Ht 5\' 8"  (1.727 m)  Wt 161 lb (73 kg)   SpO2 97%   BMI 24.48 kg/m   Wt Readings from Last 3 Encounters:  07/10/23 161 lb (73 kg)  04/25/23 161 lb (73 kg)  02/27/23 162 lb (73.5 kg)    Physical Exam Vitals and nursing note reviewed.  Constitutional:      General: She is not in acute distress.    Appearance: She is well-developed. She is not diaphoretic.  Eyes:     Conjunctiva/sclera: Conjunctivae normal.  Cardiovascular:     Rate and Rhythm: Normal rate and regular rhythm.     Heart sounds: Normal heart sounds. No murmur heard. Pulmonary:     Effort: Pulmonary effort is  normal. No respiratory distress.     Breath sounds: Normal breath sounds. No wheezing.  Musculoskeletal:        General: No swelling.  Skin:    General: Skin is warm and dry.     Findings: No rash.  Neurological:     Mental Status: She is alert and oriented to person, place, and time.     Coordination: Coordination normal.  Psychiatric:        Behavior: Behavior normal.     Results for orders placed or performed in visit on 07/08/23  Bayer DCA Hb A1c Waived   Collection Time: 07/08/23  8:31 AM  Result Value Ref Range   HB A1C (BAYER DCA - WAIVED) 5.9 (H) 4.8 - 5.6 %  TSH   Collection Time: 07/08/23  8:33 AM  Result Value Ref Range   TSH 1.170 0.450 - 4.500 uIU/mL  Lipid panel   Collection Time: 07/08/23  8:33 AM  Result Value Ref Range   Cholesterol, Total 165 100 - 199 mg/dL   Triglycerides 67 0 - 149 mg/dL   HDL 60 >16 mg/dL   VLDL Cholesterol Cal 13 5 - 40 mg/dL   LDL Chol Calc (NIH) 92 0 - 99 mg/dL   Chol/HDL Ratio 2.8 0.0 - 4.4 ratio  CMP14+EGFR   Collection Time: 07/08/23  8:33 AM  Result Value Ref Range   Glucose 94 70 - 99 mg/dL   BUN 14 8 - 27 mg/dL   Creatinine, Ser 1.09 0.57 - 1.00 mg/dL   eGFR 94 >60 AV/WUJ/8.11   BUN/Creatinine Ratio 24 12 - 28   Sodium 141 134 - 144 mmol/L   Potassium 3.7 3.5 - 5.2 mmol/L   Chloride 105 96 - 106 mmol/L   CO2 23 20 - 29 mmol/L   Calcium 9.5 8.7 - 10.3 mg/dL   Total Protein 6.6 6.0 - 8.5 g/dL   Albumin 3.8 3.8 - 4.8 g/dL   Globulin, Total 2.8 1.5 - 4.5 g/dL   Bilirubin Total 0.4 0.0 - 1.2 mg/dL   Alkaline Phosphatase 59 44 - 121 IU/L   AST 17 0 - 40 IU/L   ALT 11 0 - 32 IU/L  CBC with Differential/Platelet   Collection Time: 07/08/23  8:33 AM  Result Value Ref Range   WBC 3.2 (L) 3.4 - 10.8 x10E3/uL   RBC 4.12 3.77 - 5.28 x10E6/uL   Hemoglobin 12.4 11.1 - 15.9 g/dL   Hematocrit 91.4 78.2 - 46.6 %   MCV 93 79 - 97 fL   MCH 30.1 26.6 - 33.0 pg   MCHC 32.4 31.5 - 35.7 g/dL   RDW 95.6 21.3 - 08.6 %   Platelets  202 150 - 450 x10E3/uL   Neutrophils 38 Not Estab. %   Lymphs 46 Not Estab. %  Monocytes 12 Not Estab. %   Eos 3 Not Estab. %   Basos 1 Not Estab. %   Neutrophils Absolute 1.2 (L) 1.4 - 7.0 x10E3/uL   Lymphocytes Absolute 1.5 0.7 - 3.1 x10E3/uL   Monocytes Absolute 0.4 0.1 - 0.9 x10E3/uL   EOS (ABSOLUTE) 0.1 0.0 - 0.4 x10E3/uL   Basophils Absolute 0.0 0.0 - 0.2 x10E3/uL   Immature Granulocytes 0 Not Estab. %   Immature Grans (Abs) 0.0 0.0 - 0.1 x10E3/uL    Assessment & Plan:   Problem List Items Addressed This Visit       Cardiovascular and Mediastinum   Hypertension - Primary   Relevant Orders   CBC with Differential/Platelet     Endocrine   Hypothyroidism   Relevant Orders   CBC with Differential/Platelet   CMP14+EGFR   TSH   Right thyroid  nodule   Relevant Orders   US  THYROID      Other   Prediabetes   Relevant Orders   Bayer DCA Hb A1c Waived   CBC with Differential/Platelet   Hyperlipidemia   Relevant Orders   CBC with Differential/Platelet   Lipid panel    Blood work looks good, A1c and cholesterol all look better.  Continue with diet.  No changes Follow up plan: Return in about 6 months (around 01/10/2024), or if symptoms worsen or fail to improve, for Physical exam and hypertension and prediabetes and thyroid  and cholesterol.  Counseling provided for all of the vaccine components Orders Placed This Encounter  Procedures   US  THYROID    Bayer DCA Hb A1c Waived   CBC with Differential/Platelet   CMP14+EGFR   Lipid panel   TSH    Jolyne Needs, MD [facility] 07/10/2023, 8:46 AM

## 2023-07-10 NOTE — Telephone Encounter (Signed)
 Future lab orders placed. Pt has an appt 11/17 for lab.

## 2023-07-27 DIAGNOSIS — M5416 Radiculopathy, lumbar region: Secondary | ICD-10-CM | POA: Diagnosis not present

## 2023-07-31 ENCOUNTER — Ambulatory Visit (HOSPITAL_COMMUNITY)
Admission: RE | Admit: 2023-07-31 | Discharge: 2023-07-31 | Disposition: A | Discharge status: Home / Self Care | Source: Ambulatory Visit | Attending: Family Medicine | Admitting: Family Medicine

## 2023-07-31 ENCOUNTER — Ambulatory Visit: Payer: Self-pay | Admitting: Family Medicine

## 2023-07-31 DIAGNOSIS — E041 Nontoxic single thyroid nodule: Secondary | ICD-10-CM | POA: Diagnosis not present

## 2023-08-20 ENCOUNTER — Encounter: Payer: Self-pay | Admitting: Family

## 2023-08-20 ENCOUNTER — Ambulatory Visit (INDEPENDENT_AMBULATORY_CARE_PROVIDER_SITE_OTHER): Admitting: Family

## 2023-08-20 VITALS — BP 137/72 | HR 83 | Temp 98.4°F | Ht 68.0 in | Wt 158.4 lb

## 2023-08-20 DIAGNOSIS — R6889 Other general symptoms and signs: Secondary | ICD-10-CM | POA: Diagnosis not present

## 2023-08-20 DIAGNOSIS — S40862A Insect bite (nonvenomous) of left upper arm, initial encounter: Secondary | ICD-10-CM

## 2023-08-20 DIAGNOSIS — R52 Pain, unspecified: Secondary | ICD-10-CM

## 2023-08-20 DIAGNOSIS — W57XXXA Bitten or stung by nonvenomous insect and other nonvenomous arthropods, initial encounter: Secondary | ICD-10-CM | POA: Diagnosis not present

## 2023-08-20 LAB — URINALYSIS
Bilirubin, UA: NEGATIVE
Glucose, UA: NEGATIVE
Nitrite, UA: NEGATIVE
Specific Gravity, UA: 1.025 (ref 1.005–1.030)
Urobilinogen, Ur: 1 mg/dL (ref 0.2–1.0)
pH, UA: 6.5 (ref 5.0–7.5)

## 2023-08-20 MED ORDER — DOXYCYCLINE HYCLATE 100 MG PO TABS: 100.0000 mg | ORAL_TABLET | Freq: Two times a day (BID) | ORAL | 0 refills | Status: DC

## 2023-08-20 NOTE — Patient Instructions (Addendum)
Tick Bite Information, Adult  Ticks are insects that draw blood for food. They climb onto people and animals that brush against the leaves and grasses that they live in. They then bite and attach to the skin. Most ticks are harmless, but some ticks may carry germs that can cause disease. These germs are spread to a person through a bite. To lower your risk of getting a disease from a tick bite, make sure you: Take steps to prevent tick bites. Check for ticks after being outdoors where ticks live. Watch for symptoms of disease if a tick attached to you or if you think a tick bit you. How can I prevent tick bites? Take these steps to help prevent tick bites when you go outdoors in an area where ticks live: Before you go outdoors: Wear long sleeves and long pants to protect your skin from ticks. Wear light-colored clothing so you can see ticks easier. Tuck your pant legs into your socks. Apply insect repellent that has DEET (20% or higher), picaridin, or IR3535 in it to the following areas: Any bare skin. Avoid areas around the eyes and mouth. Edges of clothing, like the top of your boots, the bottom of your pant legs, and your sleeve cuffs. Consider applying an insect repellant that contains permethrin. Follow the instructions on the label. Do not apply permethrin directly to the skin. Instead, apply to the following areas: Clothing and shoes. Outdoor gear and tents. When you are outdoors: Avoid walking through areas with long grass. If you are walking on a trail, stay in the middle of the trail so your skin, hair, and clothing do not touch the bushes. Check for ticks on your clothing, hair, and skin often while you are outdoors. Check again before you go inside. When you go indoors: Check your clothing for ticks. Tumble dry clothes in a dryer on high heat for at least 10 minutes. If clothes are damp, additional time may be needed. If clothes require washing, use hot water. Check your gear and  pets. Shower soon after being outdoors. Check your body for ticks. Do a full body check using a mirror. Be sure to check your scalp, neck, armpits, waist, groin, and joint areas. These are the spots where ticks attach themselves most often. What is the best way to remove a tick?  Remove the tick as soon as possible. Removing it can prevent germs from passing to your body. Do not remove the tick with your bare fingers. Do not try to remove a tick with heat, alcohol, petroleum jelly, or fingernail polish. These things can cause the tick to salivate and regurgitate into your bloodstream, increasing your risk of getting a disease. To remove a tick that is crawling on your skin: Go outside and brush the tick off. Use tape or a lint roller. To remove a tick that is attached to your skin: Wash your hands. If you have gloves, put them on. Use a fine-tipped tweezer, curved forceps, or a tick-removal tool to gently grasp the tick as close to your skin and the tick's head as possible. Gently pull with a steady, upward, and even pressure until the tick lets go. While removing the tick: Take care to keep the tick's head attached to its body. Do not twist or jerk the tick. This can make the tick's head or mouth parts break off and stay in your skin. If this happens, try to remove the mouth parts with tweezers. If you cannot remove them, leave   the area alone and let the skin heal. Do not squeeze or crush the tick's body. This could force disease-carrying fluids from the tick into your body. What should I do after removing a tick? Clean the bite area and your hands with soap and water, rubbing alcohol, or an iodine scrub. If an antiseptic cream or ointment is available, put a small amount on the bite area. Wash and disinfect any tools that you used to remove the tick. How should I dispose of a tick? To dispose of a live tick, use one of these methods: Place it in rubbing alcohol. Place it in a sealed bag  or container, and throw it away. Wrap it tightly in tape, and throw it away. Flush it down the toilet. Where to find more information Centers for Disease Control and Prevention: cdc.gov/ticks [place] Environmental Protection Agency: epa.gov/insect-repellents Contact a health care provider if: You have symptoms of a disease after a tick bite. Symptoms of a tick-borne disease can occur from moments after the tick bites to 30 days after a tick is removed. Symptoms include: Fever or chills. A red rash that makes a circle (bull's-eye rash) in the bite area. Redness and swelling in the bite area. Headache or stiff neck. Muscle, joint, or bone pain. Abnormal tiredness. Numbness in your legs or trouble walking or moving your legs. Tender or swollen lymph glands. Abdominal pain, vomiting, diarrhea, or weight loss. Get help right away if: You are not able to remove a tick. You have muscle weakness or paralysis. Your symptoms get worse or you experience new symptoms. You find an engorged tick on your skin and you are in an area where there is a higher risk of disease from ticks. Summary Ticks may carry germs that can spread to a person through a bite. These germs can cause disease. Wear protective clothing and use insect repellent to prevent tick bites. Follow the instructions on the label. If you find a tick on your body, remove it as soon as possible. If the tick is attached, do not try to remove it with heat, alcohol, petroleum jelly, or fingernail polish. If you have symptoms of a disease after being bitten by a tick, contact a health care provider. This information is not intended to replace advice given to you by your health care provider. Make sure you discuss any questions you have with your health care provider. Document Revised: 05/08/2021 Document Reviewed: 05/08/2021 Elsevier Patient Education  2024 Elsevier Inc.  

## 2023-08-20 NOTE — Addendum Note (Signed)
 Addended by: LAVELL LYE A on: 08/20/2023 03:56 PM   Modules accepted: Orders

## 2023-08-20 NOTE — Progress Notes (Signed)
 Subjective:    Patient ID: Theresa Gamble, female    DOB: August 16, 1945, 78 y.o.   MRN: [id number]  Chief Complaint  Patient presents with   Insect Bite         Tick left upper arm about 2 weeks ago. Tired all the time and no energy body       HPI Pt presents to the office today with a tick bite of left upper bicep that she removed two weeks ago. Reports headache, body aches, weakness, chills, fatigue, decrease appetite, and nausea since 08/16/23. Denies any rash or fever. States she has been sleeping all day and feels horrible.   Denies any UTI symptoms.    Review of Systems  All other systems reviewed and are negative.   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  Tobacco Use   Smoking status: Never   Smokeless tobacco: Never  Vaping Use   Vaping status: Never Used  Substance and Sexual Activity   Alcohol use: Never   Drug use: Never   Sexual activity: Yes    Birth control/protection: Surgical  Other Topics Concern   Not on file  Social History Narrative   Not on file   Social Drivers of Health   Financial Resource Strain: Low Risk  (02/27/2023)   Overall Financial Resource Strain (CARDIA)    Difficulty of Paying Living Expenses: Not hard at all  Food Insecurity: No Food Insecurity (02/27/2023)   Hunger Vital Sign    Worried About Running Out of Food in the Last Year: Never true    Ran Out of Food in the Last Year: Never true  Transportation Needs: No Transportation Needs (02/27/2023)   PRAPARE - Administrator, Civil Service (Medical): No    Lack of Transportation (Non-Medical): No  Physical Activity: Insufficiently Active (02/27/2023)   Exercise Vital Sign    Days of Exercise per Week: 3 days    Minutes  of Exercise per Session: 30 min  Stress: No Stress Concern Present (02/27/2023)   [employer] of Occupational Health - Occupational Stress Questionnaire    Feeling of Stress : Not at all  Social Connections: Moderately Integrated (02/27/2023)   Social Connection and Isolation Panel    Frequency of Communication with Friends and Family: More than three times a week    Frequency of Social Gatherings with Friends and Family: Three times a week    Attends Religious Services: More than 4 times per year    Active Member of Clubs or Organizations: No    Attends Banker Meetings: Never    Marital Status: Married   Family History  Problem Relation Age of Onset   Heart disease Mother    Diabetes Mother    Stroke Father    Colon cancer Neg Hx    Colon polyps Neg Hx    Esophageal cancer Neg Hx    Rectal cancer Neg Hx    Stomach cancer Neg Hx    Breast cancer Neg Hx         Objective:   Physical Exam Vitals reviewed.  Constitutional:      General: She is not in acute distress.    Appearance:  She is well-developed. She is ill-appearing.  HENT:     Head: Normocephalic and atraumatic.   Eyes:     Pupils: Pupils are equal, round, and reactive to light.   Neck:     Thyroid : No thyromegaly.   Cardiovascular:     Rate and Rhythm: Normal rate and regular rhythm.     Heart sounds: Normal heart sounds. No murmur heard. Pulmonary:     Effort: Pulmonary effort is normal. No respiratory distress.     Breath sounds: Normal breath sounds. No wheezing.  Abdominal:     General: Bowel sounds are normal. There is no distension.     Palpations: Abdomen is soft.     Tenderness: There is no abdominal tenderness.   Musculoskeletal:        General: No tenderness. Normal range of motion.     Cervical back: Normal range of motion and neck supple.   Skin:    General: Skin is warm and dry.     Findings: Erythema present.         Comments: Small erythemas area on left upper arm  approx 0.3X0.4 cm   Neurological:     Mental Status: She is alert and oriented to person, place, and time.     Cranial Nerves: No cranial nerve deficit.     Deep Tendon Reflexes: Reflexes are normal and symmetric.   Psychiatric:        Behavior: Behavior normal.        Thought Content: Thought content normal.        Judgment: Judgment normal.       BP 137/72   Pulse 83   Temp 98.4 F (36.9 C) (Temporal)   Ht 5' 8 (1.727 m)   Wt 158 lb 6.4 oz (71.8 kg)   BMI 24.08 kg/m      Assessment & Plan:  Alfa Leibensperger comes in today with chief complaint of Insect Bite (/////Tick left upper arm about 2 weeks ago. Tired all the time and no energy body //)   Diagnosis and orders addressed:  1. Body aches (Primary) - CMP14+EGFR - CBC with Differential/Platelet - Urinalysis  2. Tick bite of left upper arm, initial encounter -Pt to report any new fever, joint pain, or rash -Wear protective clothing while outside- Long sleeves and long pants -Put insect repellent on all exposed skin and along clothing -Take a shower as soon as possible after being outside Follow up if symptoms worsen or do not improve  - CMP14+EGFR - CBC with Differential/Platelet - Urinalysis - [place] spotted fvr abs pnl(IgG+IgM) - Lyme Disease Serology w/Reflex - doxycycline (VIBRA-TABS) 100 MG tablet; Take 1 tablet (100 mg total) by mouth 2 (two) times daily.  Dispense: 42 tablet; Refill: 0  3. Flu-like symptoms - CMP14+EGFR - CBC with Differential/Platelet - Urinalysis   Labs pending to rule out other causes other than tick Continue current medications  Start doxycyline  Follow up if symptoms worsen or do not improve   Bari Learn, FNP

## 2023-08-21 LAB — CBC WITH DIFFERENTIAL/PLATELET
Basophils Absolute: 0 10*3/uL (ref 0.0–0.2)
Basos: 1 %
EOS (ABSOLUTE): 0 10*3/uL (ref 0.0–0.4)
Eos: 0 %
Hematocrit: 39.1 % (ref 34.0–46.6)
Hemoglobin: 13.1 g/dL (ref 11.1–15.9)
Immature Grans (Abs): 0 10*3/uL (ref 0.0–0.1)
Immature Granulocytes: 0 %
Lymphocytes Absolute: 0.5 10*3/uL — ABNORMAL LOW (ref 0.7–3.1)
Lymphs: 24 %
MCH: 30.1 pg (ref 26.6–33.0)
MCHC: 33.5 g/dL (ref 31.5–35.7)
MCV: 90 fL (ref 79–97)
Monocytes Absolute: 0.1 10*3/uL (ref 0.1–0.9)
Monocytes: 6 %
Neutrophils Absolute: 1.3 10*3/uL — ABNORMAL LOW (ref 1.4–7.0)
Neutrophils: 69 %
RBC: 4.35 x10E6/uL (ref 3.77–5.28)
RDW: 13.2 % (ref 11.7–15.4)
WBC: 1.9 10*3/uL — CL (ref 3.4–10.8)

## 2023-08-21 LAB — CMP14+EGFR
ALT: 112 IU/L — ABNORMAL HIGH (ref 0–32)
AST: 137 IU/L — ABNORMAL HIGH (ref 0–40)
Albumin: 3.6 g/dL — ABNORMAL LOW (ref 3.8–4.8)
Alkaline Phosphatase: 163 IU/L — ABNORMAL HIGH (ref 44–121)
BUN/Creatinine Ratio: 24 (ref 12–28)
BUN: 16 mg/dL (ref 8–27)
Bilirubin Total: 0.5 mg/dL (ref 0.0–1.2)
CO2: 18 mmol/L — ABNORMAL LOW (ref 20–29)
Calcium: 9 mg/dL (ref 8.7–10.3)
Chloride: 93 mmol/L — ABNORMAL LOW (ref 96–106)
Creatinine, Ser: 0.68 mg/dL (ref 0.57–1.00)
Globulin, Total: 3.4 g/dL (ref 1.5–4.5)
Glucose: 132 mg/dL — ABNORMAL HIGH (ref 70–99)
Potassium: 3.4 mmol/L — ABNORMAL LOW (ref 3.5–5.2)
Sodium: 131 mmol/L — ABNORMAL LOW (ref 134–144)
Total Protein: 7 g/dL (ref 6.0–8.5)
eGFR: 90 mL/min/{1.73_m2} (ref >59)

## 2023-08-21 LAB — LYME DISEASE SEROLOGY W/REFLEX: Lyme Total Antibody EIA: NEGATIVE

## 2023-08-22 ENCOUNTER — Ambulatory Visit: Payer: Self-pay | Admitting: Family

## 2023-08-22 ENCOUNTER — Other Ambulatory Visit: Payer: Self-pay | Admitting: Family

## 2023-08-22 ENCOUNTER — Telehealth: Payer: Self-pay

## 2023-08-22 DIAGNOSIS — R7989 Other specified abnormal findings of blood chemistry: Secondary | ICD-10-CM

## 2023-08-22 DIAGNOSIS — D72819 Decreased white blood cell count, unspecified: Secondary | ICD-10-CM

## 2023-08-22 LAB — SPOTTED FEVER GROUP ANTIBODIES
Spotted Fever Group IgG: <1:64 {titer}
Spotted Fever Group IgM: <1:64 {titer}

## 2023-08-22 NOTE — Telephone Encounter (Signed)
 Orders placed please schedule labs

## 2023-08-22 NOTE — Addendum Note (Signed)
 Addended by: MICHELINE ROSINA FALCON on: 08/22/2023 04:33 PM   Modules accepted: Orders

## 2023-08-22 NOTE — Telephone Encounter (Signed)
 Copied from CRM [id number]. Topic: Clinical - Request for Lab/Test Order >> Aug 22, 2023  2:56 PM Tobias L wrote: Reason for CRM: Pt calling to schedule lab appointment to have liver enzymes rechecked. Do not see orders for recheck placed yet, patient requesting call to be scheduled for this Monday per Christy's note.   Best call back: [phone]

## 2023-08-24 ENCOUNTER — Encounter: Payer: Self-pay | Admitting: Family Medicine

## 2023-08-26 ENCOUNTER — Other Ambulatory Visit

## 2023-08-26 DIAGNOSIS — R7989 Other specified abnormal findings of blood chemistry: Secondary | ICD-10-CM

## 2023-08-26 LAB — CMP14+EGFR
ALT: 47 IU/L — ABNORMAL HIGH (ref 0–32)
AST: 38 IU/L (ref 0–40)
Albumin: 3.5 g/dL — ABNORMAL LOW (ref 3.8–4.8)
Alkaline Phosphatase: 99 IU/L (ref 44–121)
BUN/Creatinine Ratio: 41 — ABNORMAL HIGH (ref 12–28)
BUN: 25 mg/dL (ref 8–27)
Bilirubin Total: 0.2 mg/dL (ref 0.0–1.2)
CO2: 21 mmol/L (ref 20–29)
Calcium: 9.6 mg/dL (ref 8.7–10.3)
Chloride: 108 mmol/L — ABNORMAL HIGH (ref 96–106)
Creatinine, Ser: 0.61 mg/dL (ref 0.57–1.00)
Globulin, Total: 3 g/dL (ref 1.5–4.5)
Glucose: 100 mg/dL — ABNORMAL HIGH (ref 70–99)
Potassium: 3.7 mmol/L (ref 3.5–5.2)
Sodium: 144 mmol/L (ref 134–144)
Total Protein: 6.5 g/dL (ref 6.0–8.5)
eGFR: 93 mL/min/1.73 (ref >59)

## 2023-08-26 NOTE — Telephone Encounter (Signed)
LMTCB to schedule lab appt.

## 2023-08-27 ENCOUNTER — Ambulatory Visit: Payer: Self-pay | Admitting: Family

## 2023-08-27 ENCOUNTER — Other Ambulatory Visit

## 2023-08-27 NOTE — Progress Notes (Signed)
 [facility] [address], [place] [address]   Clinic Day:  08/28/2023  Referring physician: Lavell Bari LABOR, FNP  Patient Care Team: Dettinger, Fonda LABOR, MD as PCP - General (Family Medicine) Bonner Ade, MD as Consulting Physician (Physical Medicine and Rehabilitation) [facility]   ASSESSMENT & PLAN:   Assessment:  1.  Leukopenia/neutropenia: - Patient seen at the request of Bari Lavell, FNP - She reported tick bite on her left arm in the second week of June.  She developed myalgias and fatigue.  No target rash reported. - Labs on 08/20/2023: WBC 1.9, ANC 1.3.  LFTs were elevated. - Lyme antibody titer and RMSF antibodies were negative. - She was started on doxycycline  100 mg twice daily.  She feels improvement in her symptoms since doxycycline  started.  2.  Social/family history: - Lives with husband at home and is independent of ADLs and IADLs.  Non-smoker.  No chemical exposure. - No family history of leukemia.  Brother had pancreatic cancer.  Plan:  1.  Leukopenia/neutropenia: - Labs on 08/20/2023 showed transient leukopenia from tick bite.  However her white count was low on 07/08/2023 at 3.2 and ANC of 1.2.  Prior to that her white count is normal. - Will repeat CBC today and check for nutritional deficiencies.  Will also send comprehensive hematological panel as she had elevated lymphocyte count. - RTC 3 weeks for follow-up.   Orders Placed This Encounter  Procedures   CBC with Differential    Standing Status:   Future    Number of Occurrences:   1    Expected Date:   08/28/2023    Expiration Date:   11/26/2023   Reticulocytes    Standing Status:   Future    Number of Occurrences:   1    Expected Date:   08/28/2023    Expiration Date:   11/26/2023   Lactate dehydrogenase    Standing Status:   Future    Number of Occurrences:   1    Expected Date:   08/28/2023    Expiration Date:   11/26/2023   Copper , serum     Standing Status:   Future    Number of Occurrences:   1    Expected Date:   08/28/2023    Expiration Date:   11/26/2023   Vitamin B12    Standing Status:   Future    Number of Occurrences:   1    Expected Date:   08/28/2023    Expiration Date:   11/26/2023   Folate    Standing Status:   Future    Number of Occurrences:   1    Expected Date:   08/28/2023    Expiration Date:   11/26/2023   Methylmalonic acid, serum    Standing Status:   Future    Number of Occurrences:   1    Expected Date:   08/28/2023    Expiration Date:   11/26/2023   Miscellaneous [facility] test (send-out)    Standing Status:   Future    Number of Occurrences:   1    Expected Date:   08/28/2023    Expiration Date:   11/26/2023    Test name / description::   Heme NGS - test code [id number]      I,Helena R Teague,acting as a scribe for Alean Stands, MD.,have documented all relevant documentation on the behalf of Alean Stands, MD,as directed by  [employer]  Rogers, MD while in the presence of Alean Rogers, MD.   I, Alean Rogers MD, have reviewed the above documentation for accuracy and completeness, and I agree with the above.   Alean Rogers, MD   7/9/202511:44 AM  CHIEF COMPLAINT/PURPOSE OF CONSULT:   Diagnosis: Leukopenia  Current Therapy: Under workup  HISTORY OF PRESENT ILLNESS:   Theresa Gamble is a 78 y.o. female presenting to clinic today for evaluation of leukopenia at the request of Lavell Bari LABOR, FNP.  Patient has a medical history of hypertension, hypothyroidism, prediabetes, hyperlipidemia, and lumbar radiculopathy.   Theresa Gamble was seen by her FNP on 08/20/23 for flu-like symptoms and a recent tick bite that prompted blood work to be done. CBC diff from 08/20/23 showed severely low platelets at 1.9, low absolute neutrophils at 1.3, and low absolute lymphocytes at 0.5. Platelets could not be measured due to aggregation. CMP from 08/26/23 showed low albumin at 3.5, elevated ALT at 47,  elevated chloride at 108, and elevated BUN/creatinine ratio at 41. Urinalysis from 08/20/23 was positive for leukocytes (+1), positive for protein (+2), positive for ketones (+4), and trace amounts of RBC was shown. Blood work testing tick infection was negative.   Her most recent colonoscopy was on 03/24/2020.   Today, she states that she is doing well overall. Her appetite level is at 50%. Her energy level is at 80%.  PAST MEDICAL HISTORY:   Past Medical History: Past Medical History:  Diagnosis Date   Allergy    seasonal allergies   Arthritis    back   Cataract    bilateral sx   Hypertension     Surgical History: Past Surgical History:  Procedure Laterality Date   ABDOMINAL HYSTERECTOMY     BREAST BIOPSY     CHOLECYSTECTOMY     EYE SURGERY Bilateral    cataract removal    Social History: 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  Tobacco Use   Smoking status: Never   Smokeless tobacco: Never  Vaping Use   Vaping status: Never Used  Substance and Sexual Activity   Alcohol use: Never   Drug use: Never   Sexual activity: Yes    Birth control/protection: Surgical  Other Topics Concern   Not on file  Social History Narrative   Not on file   Social Drivers of Health   Financial Resource Strain: Low Risk  (02/27/2023)   Overall Financial Resource Strain (CARDIA)    Difficulty of Paying Living Expenses: Not hard at all  Food Insecurity: No Food Insecurity (08/28/2023)   Hunger Vital Sign    Worried About Running Out of Food in the Last Year: Never true    Ran Out of Food in the Last Year: Never true  Transportation Needs: No Transportation Needs (08/28/2023)   PRAPARE - Administrator, Civil Service (Medical): No     Lack of Transportation (Non-Medical): No  Physical Activity: Insufficiently Active (02/27/2023)   Exercise Vital Sign    Days of Exercise per Week: 3 days    Minutes of Exercise per Session: 30 min  Stress: No Stress Concern Present (02/27/2023)   [employer] of Occupational Health - Occupational Stress Questionnaire  Feeling of Stress : Not at all  Social Connections: Moderately Integrated (02/27/2023)   Social Connection and Isolation Panel    Frequency of Communication with Friends and Family: More than three times a week    Frequency of Social Gatherings with Friends and Family: Three times a week    Attends Religious Services: More than 4 times per year    Active Member of Clubs or Organizations: No    Attends Banker Meetings: Never    Marital Status: Married  Catering manager Violence: Not At Risk (08/28/2023)   Humiliation, Afraid, Rape, and Kick questionnaire    Fear of Current or Ex-Partner: No    Emotionally Abused: No    Physically Abused: No    Sexually Abused: No    Family History: Family History  Problem Relation Age of Onset   Heart disease Mother    Diabetes Mother    Stroke Father    Colon cancer Neg Hx    Colon polyps Neg Hx    Esophageal cancer Neg Hx    Rectal cancer Neg Hx    Stomach cancer Neg Hx    Breast cancer Neg Hx     Current Medications:  Current Outpatient Medications:    aspirin EC 81 MG tablet, Take 81 mg by mouth daily., Disp: , Rfl:    calcium carbonate (OSCAL) 1500 (600 Ca) MG TABS tablet, Take 600 mg of elemental calcium by mouth daily with breakfast., Disp: , Rfl:    doxycycline  (VIBRA -TABS) 100 MG tablet, Take 1 tablet (100 mg total) by mouth 2 (two) times daily., Disp: 42 tablet, Rfl: 0   hydrochlorothiazide  (HYDRODIURIL ) 12.5 MG tablet, Take 1 tablet (12.5 mg total) by mouth daily., Disp: 90 tablet, Rfl: 3   levothyroxine  (SYNTHROID ) 50 MCG tablet, Take 1 tablet (50 mcg total) by mouth daily., Disp: 90 tablet, Rfl:  3   Multiple Vitamin (MULTIVITAMIN) tablet, Take 1 tablet by mouth daily., Disp: , Rfl:    ULTRAM 50 MG tablet, Take 1 tablet 3 times a day by oral route as needed for pain., Disp: , Rfl:    Allergies: Allergies  Allergen Reactions   Latex Rash    REVIEW OF SYSTEMS:   Review of Systems  Constitutional:  Negative for chills, fatigue and fever.  HENT:   Negative for lump/mass, mouth sores, nosebleeds, sore throat and trouble swallowing.   Eyes:  Negative for eye problems.  Respiratory:  Negative for cough and shortness of breath.   Cardiovascular:  Negative for chest pain, leg swelling and palpitations.  Gastrointestinal:  Negative for abdominal pain, constipation, diarrhea, nausea and vomiting.  Genitourinary:  Negative for bladder incontinence, difficulty urinating, dysuria, frequency, hematuria and nocturia.   Musculoskeletal:  Negative for arthralgias, back pain, flank pain, myalgias and neck pain.  Skin:  Negative for itching and rash.  Neurological:  Negative for dizziness, headaches and numbness.  Hematological:  Does not bruise/bleed easily.  Psychiatric/Behavioral:  Negative for depression, sleep disturbance and suicidal ideas. The patient is not nervous/anxious.   All other systems reviewed and are negative.    VITALS:   Blood pressure (!) 146/80, pulse 83, temperature 97.7 F (36.5 C), temperature source Tympanic, resp. rate 18, height 5' 8 (1.727 m), weight 157 lb (71.2 kg), SpO2 98%.  Wt Readings from Last 3 Encounters:  08/28/23 157 lb (71.2 kg)  08/20/23 158 lb 6.4 oz (71.8 kg)  07/10/23 161 lb (73 kg)    Body mass index is 23.87 kg/m.  PHYSICAL EXAM:   Physical Exam Vitals and nursing note reviewed. Exam conducted with a chaperone present.  Constitutional:      Appearance: Normal appearance.  Cardiovascular:     Rate and Rhythm: Normal rate and regular rhythm.     Pulses: Normal pulses.     Heart sounds: Normal heart sounds.  Pulmonary:     Effort:  Pulmonary effort is normal.     Breath sounds: Normal breath sounds.  Abdominal:     Palpations: Abdomen is soft. There is no hepatomegaly, splenomegaly or mass.     Tenderness: There is no abdominal tenderness.  Musculoskeletal:     Right lower leg: No edema.     Left lower leg: No edema.  Lymphadenopathy:     Cervical: No cervical adenopathy.     Right cervical: No superficial, deep or posterior cervical adenopathy.    Left cervical: No superficial, deep or posterior cervical adenopathy.     Upper Body:     Right upper body: No supraclavicular or axillary adenopathy.     Left upper body: No supraclavicular or axillary adenopathy.  Neurological:     General: No focal deficit present.     Mental Status: She is alert and oriented to person, place, and time.  Psychiatric:        Mood and Affect: Mood normal.        Behavior: Behavior normal.     LABS:   CBC    Component Value Date/Time   WBC 8.0 08/28/2023 0906   RBC 4.08 08/28/2023 0907   RBC 4.12 08/28/2023 0906   HGB 12.5 08/28/2023 0906   HGB 13.1 08/20/2023 1555   HCT 38.5 08/28/2023 0906   HCT 39.1 08/20/2023 1555   PLT 350 08/28/2023 0906   PLT CANCELED 08/20/2023 1555   MCV 93.4 08/28/2023 0906   MCV 90 08/20/2023 1555   MCH 30.3 08/28/2023 0906   MCHC 32.5 08/28/2023 0906   RDW 15.3 08/28/2023 0906   RDW 13.2 08/20/2023 1555   LYMPHSABS 4.8 (H) 08/28/2023 0906   LYMPHSABS 0.5 (L) 08/20/2023 1555   MONOABS 0.8 08/28/2023 0906   EOSABS 0.1 08/28/2023 0906   EOSABS 0.0 08/20/2023 1555   BASOSABS 0.1 08/28/2023 0906   BASOSABS 0.0 08/20/2023 1555    CMP    Component Value Date/Time   NA 144 08/26/2023 1018   K 3.7 08/26/2023 1018   CL 108 (H) 08/26/2023 1018   CO2 21 08/26/2023 1018   GLUCOSE 100 (H) 08/26/2023 1018   GLUCOSE 214 (H) 07/05/2017 1312   BUN 25 08/26/2023 1018   CREATININE 0.61 08/26/2023 1018   CALCIUM 9.6 08/26/2023 1018   PROT 6.5 08/26/2023 1018   ALBUMIN 3.5 (L) 08/26/2023 1018    AST 38 08/26/2023 1018   ALT 47 (H) 08/26/2023 1018   ALKPHOS 99 08/26/2023 1018   BILITOT 0.2 08/26/2023 1018   GFRNONAA 92 01/04/2020 1056   GFRAA 106 01/04/2020 1056    No results found for: CEA1, CEA / No results found for: CEA1, CEA No results found for: PSA1 No results found for: CAN199 No results found for: CAN125  No results found for: TOTALPROTELP, ALBUMINELP, A1GS, A2GS, BETS, BETA2SER, GAMS, MSPIKE, SPEI No results found for: TIBC, FERRITIN, IRONPCTSAT Lab Results  Component Value Date   LDH 186 08/28/2023     STUDIES:   US  THYROID  Result Date: 07/31/2023 CLINICAL DATA:  Follow-up thyroid  nodule EXAM: THYROID  ULTRASOUND TECHNIQUE: Ultrasound examination of the thyroid  gland and adjacent  soft tissues was performed. COMPARISON:  Thyroid  ultrasound performed January 16, 2022 FINDINGS: Parenchymal Echotexture: Mildly heterogeneous Isthmus: 0.2 cm Right lobe: 4.5 x 1.8 x 1.9 cm Left lobe: 3.7 x 1.2 x 1.3 cm _________________________________________________________ Estimated total number of nodules >/= 1 cm: 1 Number of spongiform nodules >/=  2 cm not described below (TR1): 0 Number of mixed cystic and solid nodules >/= 1.5 cm not described below (TR2): 0 _________________________________________________________ Nodule # 1: Location: Right; Mid Maximum size: 1.8 cm; Other 2 dimensions: 1.1 x 1.6 cm Composition: solid/almost completely solid (2) Echogenicity: isoechoic (1) Shape: not taller-than-wide (0) Margins: ill-defined (0) Echogenic foci: none (0) ACR TI-RADS total points: 3. ACR TI-RADS risk category: TR3 (3 points). ACR TI-RADS recommendations: *Given size (>/= 1.5 - 2.4 cm) and appearance, a follow-up ultrasound in 1 year should be considered based on TI-RADS criteria. _________________________________________________________ IMPRESSION: 1. A TR 3 nodule is present in the right thyroid  measuring 1.8 cm. *Given size (>/= 1.5 - 2.4 cm) and  appearance, a follow-up ultrasound in 1 year should be considered based on TI-RADS criteria. Overall, this is similar when compared to ultrasound performed January 16, 2022 The above is in keeping with the ACR TI-RADS recommendations - J Am Coll Radiol 2017;14:587-595. Electronically Signed   By: Maude Naegeli M.D.   On: 07/31/2023 15:05

## 2023-08-28 ENCOUNTER — Encounter: Payer: Self-pay | Admitting: Hematology

## 2023-08-28 ENCOUNTER — Inpatient Hospital Stay

## 2023-08-28 ENCOUNTER — Inpatient Hospital Stay: Attending: Hematology | Admitting: Hematology

## 2023-08-28 VITALS — BP 146/80 | HR 83 | Temp 97.7°F | Resp 18 | Ht 68.0 in | Wt 157.0 lb

## 2023-08-28 DIAGNOSIS — D72819 Decreased white blood cell count, unspecified: Secondary | ICD-10-CM

## 2023-08-28 DIAGNOSIS — Z800 Family history of malignant neoplasm of digestive organs: Secondary | ICD-10-CM | POA: Insufficient documentation

## 2023-08-28 DIAGNOSIS — R7989 Other specified abnormal findings of blood chemistry: Secondary | ICD-10-CM | POA: Insufficient documentation

## 2023-08-28 DIAGNOSIS — D709 Neutropenia, unspecified: Secondary | ICD-10-CM | POA: Diagnosis not present

## 2023-08-28 DIAGNOSIS — S40862A Insect bite (nonvenomous) of left upper arm, initial encounter: Secondary | ICD-10-CM | POA: Diagnosis not present

## 2023-08-28 LAB — CBC WITH DIFFERENTIAL/PLATELET
Abs Immature Granulocytes: 0.03 K/uL (ref 0.00–0.07)
Basophils Absolute: 0.1 K/uL (ref 0.0–0.1)
Basophils Relative: 1 %
Eosinophils Absolute: 0.1 K/uL (ref 0.0–0.5)
Eosinophils Relative: 2 %
HCT: 38.5 % (ref 36.0–46.0)
Hemoglobin: 12.5 g/dL (ref 12.0–15.0)
Immature Granulocytes: 0 %
Lymphocytes Relative: 60 %
Lymphs Abs: 4.8 K/uL — ABNORMAL HIGH (ref 0.7–4.0)
MCH: 30.3 pg (ref 26.0–34.0)
MCHC: 32.5 g/dL (ref 30.0–36.0)
MCV: 93.4 fL (ref 80.0–100.0)
Monocytes Absolute: 0.8 K/uL (ref 0.1–1.0)
Monocytes Relative: 11 %
Neutro Abs: 2.1 K/uL (ref 1.7–7.7)
Neutrophils Relative %: 26 %
Platelets: 350 K/uL (ref 150–400)
RBC: 4.12 MIL/uL (ref 3.87–5.11)
RDW: 15.3 % (ref 11.5–15.5)
WBC: 8 K/uL (ref 4.0–10.5)
nRBC: 0 % (ref 0.0–0.2)

## 2023-08-28 LAB — RETICULOCYTES
Immature Retic Fract: 9.6 % (ref 2.3–15.9)
RBC.: 4.08 MIL/uL (ref 3.87–5.11)
Retic Count, Absolute: 53.4 K/uL (ref 19.0–186.0)
Retic Ct Pct: 1.3 % (ref 0.4–3.1)

## 2023-08-28 LAB — FOLATE: Folate: 32.2 ng/mL (ref >5.9)

## 2023-08-28 LAB — VITAMIN B12: Vitamin B-12: 464 pg/mL (ref 180–914)

## 2023-08-28 LAB — LACTATE DEHYDROGENASE: LDH: 186 U/L (ref 98–192)

## 2023-08-28 NOTE — Patient Instructions (Signed)
 You were seen and examined today by Dr. Ellin Saba. Dr. Ellin Saba is a hematologist, meaning that he specializes in blood abnormalities. Dr. Ellin Saba discussed your past medical history, family history of cancers/blood conditions and the events that led to you being here today.  You were referred to Dr. Ellin Saba due to leukopenia (low white blood cell count).  Dr. Ellin Saba has recommended additional labs today for further evaluation.  Follow-up as scheduled.

## 2023-08-29 LAB — COPPER, SERUM: Copper: 104 ug/dL (ref 80–158)

## 2023-08-31 LAB — METHYLMALONIC ACID, SERUM: Methylmalonic Acid, Quantitative: 133 nmol/L (ref 0–378)

## 2023-09-18 ENCOUNTER — Inpatient Hospital Stay: Admitting: Hematology

## 2023-09-18 VITALS — BP 135/76 | HR 82 | Temp 98.1°F | Resp 16 | Wt 158.1 lb

## 2023-09-18 DIAGNOSIS — D703 Neutropenia due to infection: Secondary | ICD-10-CM

## 2023-09-18 DIAGNOSIS — D709 Neutropenia, unspecified: Secondary | ICD-10-CM | POA: Diagnosis not present

## 2023-09-18 DIAGNOSIS — D72819 Decreased white blood cell count, unspecified: Secondary | ICD-10-CM | POA: Insufficient documentation

## 2023-09-18 NOTE — Progress Notes (Signed)
 [facility] [address] [place] [address]   Clinic Day:  09/18/2023  Referring physician: Dettinger, Theresa LABOR, MD  Patient Care Team: Gamble, Theresa LABOR, MD as PCP - General (Family Medicine) Theresa Ade, MD as Consulting Physician (Physical Medicine and Rehabilitation) [facility]   ASSESSMENT & PLAN:   Assessment:  1.  Leukopenia/neutropenia: - Patient seen at the request of Theresa Learn, FNP - She reported tick bite on her left arm in the second week of June.  She developed myalgias and fatigue.  No target rash reported. - Labs on 08/20/2023: WBC 1.9, ANC 1.3.  LFTs were elevated. - Lyme antibody titer and RMSF antibodies were negative. - She was started on doxycycline  100 mg twice daily.  She feels improvement in her symptoms since doxycycline  started.  2.  Social/family history: - Lives with husband at home and is independent of ADLs and IADLs.  Non-smoker.  No chemical exposure. - No family history of leukemia.  Brother had pancreatic cancer.  Plan:  1.  Leukopenia/neutropenia: - I reviewed labs from 08/28/2023: White count normalized at 8.  Platelet count is normal.  Differential was grossly normal. - Nutritional deficiency workup was negative.  NGS panHeme panel is pending. - She had transient leukopenia from tick bite.  She has completely recovered.  No further workup is necessary.  I will be glad to see her on an as-needed basis.   No orders of the defined types were placed in this encounter.     Theresa Gamble,acting as a Neurosurgeon for Alean Stands, MD.,have documented all relevant documentation on the behalf of Alean Stands, MD,as directed by  Alean Stands, MD while in the presence of Alean Stands, MD.  I, Alean Stands MD, have reviewed the above documentation for accuracy and completeness, and I agree with the above.    Alean Stands, MD   7/30/202512:38  PM  CHIEF COMPLAINT/PURPOSE OF CONSULT:   Diagnosis: Leukopenia  Current Therapy: Under workup  HISTORY OF PRESENT ILLNESS:   Theresa Gamble is a 78 y.o. female presenting to clinic today for evaluation of leukopenia at the request of Gamble Theresa LABOR, FNP.  Patient has a medical history of hypertension, hypothyroidism, prediabetes, hyperlipidemia, and lumbar radiculopathy.   [name] was seen by her FNP on 08/20/23 for flu-like symptoms and a recent tick bite that prompted blood work to be done. CBC diff from 08/20/23 showed severely low platelets at 1.9, low absolute neutrophils at 1.3, and low absolute lymphocytes at 0.5. Platelets could not be measured due to aggregation. CMP from 08/26/23 showed low albumin at 3.5, elevated ALT at 47, elevated chloride at 108, and elevated BUN/creatinine ratio at 41. Urinalysis from 08/20/23 was positive for leukocytes (+1), positive for protein (+2), positive for ketones (+4), and trace amounts of RBC was shown. Blood work testing tick infection was negative.   Her most recent colonoscopy was on 03/24/2020.   Today, she states that she is doing well overall. Her appetite level is at 50%. Her energy level is at 80%.  INTERVAL HISTORY:   Theresa Gamble is a 78 y.o. female presenting to the clinic today for follow-up of leukopenia. She was last seen by me on 08/28/2023 in consultation.  Today, she states that she is doing well overall. Her appetite level is at 100%. Her energy level is at 10%.   PAST MEDICAL HISTORY:   Past Medical History: Past Medical History:  Diagnosis Date   Allergy  seasonal allergies   Arthritis    back   Cataract    bilateral sx   Hypertension     Surgical History: Past Surgical History:  Procedure Laterality Date   ABDOMINAL HYSTERECTOMY     BREAST BIOPSY     CHOLECYSTECTOMY     EYE SURGERY Bilateral    cataract removal    Social History: Social History   Socioeconomic History   Marital status: Married    Spouse name:  [name]   Number of children: 2   Years of education: 12   Highest education level: High school graduate  Occupational History   Occupation: retired    Associate Professor: [employer]    Comment: 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  Tobacco Use   Smoking status: Never   Smokeless tobacco: Never  Vaping Use   Vaping status: Never Used  Substance and Sexual Activity   Alcohol use: Never   Drug use: Never   Sexual activity: Yes    Birth control/protection: Surgical  Other Topics Concern   Not on file  Social History Narrative   Not on file   Social Drivers of Health   Financial Resource Strain: Low Risk  (02/27/2023)   Overall Financial Resource Strain (CARDIA)    Difficulty of Paying Living Expenses: Not hard at all  Food Insecurity: No Food Insecurity (08/28/2023)   Hunger Vital Sign    Worried About Running Out of Food in the Last Year: Never true    Ran Out of Food in the Last Year: Never true  Transportation Needs: No Transportation Needs (08/28/2023)   PRAPARE - Administrator, Civil Service (Medical): No    Lack of Transportation (Non-Medical): No  Physical Activity: Insufficiently Active (02/27/2023)   Exercise Vital Sign    Days of Exercise per Week: 3 days    Minutes of Exercise per Session: 30 min  Stress: No Stress Concern Present (02/27/2023)   [employer] of Occupational Health - Occupational Stress Questionnaire    Feeling of Stress : Not at all  Social Connections: Moderately Integrated (02/27/2023)   Social Connection and Isolation Panel    Frequency of Communication with Friends and Family: More than three times a week    Frequency of Social Gatherings with Friends and Family: Three times a week    Attends Religious Services: More than 4 times per year    Active Member of Clubs or Organizations: No    Attends Banker Meetings: Never    Marital Status: Married  Catering manager Violence: Not At Risk (08/28/2023)   Humiliation, Afraid, Rape, and Kick questionnaire     Fear of Current or Ex-Partner: No    Emotionally Abused: No    Physically Abused: No    Sexually Abused: No    Family History: Family History  Problem Relation Age of Onset   Heart disease Mother    Diabetes Mother    Stroke Father    Colon cancer Neg Hx    Colon polyps Neg Hx    Esophageal cancer Neg Hx    Rectal cancer Neg Hx    Stomach cancer Neg Hx    Breast cancer Neg Hx     Current Medications:  Current Outpatient Medications:    aspirin EC 81 MG tablet, Take 81 mg by mouth daily., Disp: , Rfl:    calcium carbonate (OSCAL) 1500 (600 Ca) MG TABS tablet, Take 600 mg of elemental calcium by mouth daily with breakfast., Disp: , Rfl:  hydrochlorothiazide  (HYDRODIURIL ) 12.5 MG tablet, Take 1 tablet (12.5 mg total) by mouth daily., Disp: 90 tablet, Rfl: 3   levothyroxine  (SYNTHROID ) 50 MCG tablet, Take 1 tablet (50 mcg total) by mouth daily., Disp: 90 tablet, Rfl: 3   Multiple Vitamin (MULTIVITAMIN) tablet, Take 1 tablet by mouth daily., Disp: , Rfl:    ULTRAM 50 MG tablet, Take 1 tablet 3 times a day by oral route as needed for pain., Disp: , Rfl:    Allergies: Allergies  Allergen Reactions   Latex Rash    REVIEW OF SYSTEMS:   Review of Systems  Constitutional:  Negative for chills, fatigue and fever.  HENT:   Negative for lump/mass, mouth sores, nosebleeds, sore throat and trouble swallowing.   Eyes:  Negative for eye problems.  Respiratory:  Negative for cough and shortness of breath.   Cardiovascular:  Negative for chest pain, leg swelling and palpitations.  Gastrointestinal:  Negative for abdominal pain, constipation, diarrhea, nausea and vomiting.  Genitourinary:  Negative for bladder incontinence, difficulty urinating, dysuria, frequency, hematuria and nocturia.   Musculoskeletal:  Negative for arthralgias, back pain, flank pain, myalgias and neck pain.  Skin:  Negative for itching and rash.  Neurological:  Negative for dizziness, headaches and numbness.   Hematological:  Does not bruise/bleed easily.  Psychiatric/Behavioral:  Negative for depression, sleep disturbance and suicidal ideas. The patient is not nervous/anxious.   All other systems reviewed and are negative.    VITALS:   Blood pressure 135/76, pulse 82, temperature 98.1 F (36.7 C), temperature source Oral, resp. rate 16, weight 158 lb 1.1 oz (71.7 kg), SpO2 99%.  Wt Readings from Last 3 Encounters:  09/18/23 158 lb 1.1 oz (71.7 kg)  08/28/23 157 lb (71.2 kg)  08/20/23 158 lb 6.4 oz (71.8 kg)    Body mass index is 24.03 kg/m.   PHYSICAL EXAM:   Physical Exam Vitals and nursing note reviewed. Exam conducted with a chaperone present.  Constitutional:      Appearance: Normal appearance.  Cardiovascular:     Rate and Rhythm: Normal rate and regular rhythm.     Pulses: Normal pulses.     Heart sounds: Normal heart sounds.  Pulmonary:     Effort: Pulmonary effort is normal.     Breath sounds: Normal breath sounds.  Abdominal:     Palpations: Abdomen is soft. There is no hepatomegaly, splenomegaly or mass.     Tenderness: There is no abdominal tenderness.  Musculoskeletal:     Right lower leg: No edema.     Left lower leg: No edema.  Lymphadenopathy:     Cervical: No cervical adenopathy.     Right cervical: No superficial, deep or posterior cervical adenopathy.    Left cervical: No superficial, deep or posterior cervical adenopathy.     Upper Body:     Right upper body: No supraclavicular or axillary adenopathy.     Left upper body: No supraclavicular or axillary adenopathy.  Neurological:     General: No focal deficit present.     Mental Status: She is alert and oriented to person, place, and time.  Psychiatric:        Mood and Affect: Mood normal.        Behavior: Behavior normal.     LABS:   CBC    Component Value Date/Time   WBC 8.0 08/28/2023 0906   RBC 4.08 08/28/2023 0907   RBC 4.12 08/28/2023 0906   HGB 12.5 08/28/2023 0906   HGB 13.1  08/20/2023 1555   HCT 38.5 08/28/2023 0906   HCT 39.1 08/20/2023 1555   PLT 350 08/28/2023 0906   PLT CANCELED 08/20/2023 1555   MCV 93.4 08/28/2023 0906   MCV 90 08/20/2023 1555   MCH 30.3 08/28/2023 0906   MCHC 32.5 08/28/2023 0906   RDW 15.3 08/28/2023 0906   RDW 13.2 08/20/2023 1555   LYMPHSABS 4.8 (H) 08/28/2023 0906   LYMPHSABS 0.5 (L) 08/20/2023 1555   MONOABS 0.8 08/28/2023 0906   EOSABS 0.1 08/28/2023 0906   EOSABS 0.0 08/20/2023 1555   BASOSABS 0.1 08/28/2023 0906   BASOSABS 0.0 08/20/2023 1555    CMP    Component Value Date/Time   NA 144 08/26/2023 1018   K 3.7 08/26/2023 1018   CL 108 (H) 08/26/2023 1018   CO2 21 08/26/2023 1018   GLUCOSE 100 (H) 08/26/2023 1018   GLUCOSE 214 (H) 07/05/2017 1312   BUN 25 08/26/2023 1018   CREATININE 0.61 08/26/2023 1018   CALCIUM 9.6 08/26/2023 1018   PROT 6.5 08/26/2023 1018   ALBUMIN 3.5 (L) 08/26/2023 1018   AST 38 08/26/2023 1018   ALT 47 (H) 08/26/2023 1018   ALKPHOS 99 08/26/2023 1018   BILITOT 0.2 08/26/2023 1018   GFRNONAA 92 01/04/2020 1056   GFRAA 106 01/04/2020 1056    No results found for: CEA1, CEA / No results found for: CEA1, CEA No results found for: PSA1 No results found for: CAN199 No results found for: CAN125  No results found for: TOTALPROTELP, ALBUMINELP, A1GS, A2GS, BETS, BETA2SER, GAMS, MSPIKE, SPEI No results found for: TIBC, FERRITIN, IRONPCTSAT Lab Results  Component Value Date   LDH 186 08/28/2023     STUDIES:   No results found.

## 2023-09-18 NOTE — Patient Instructions (Addendum)
 [facility] at [facility] Discharge Instructions   You were seen and examined today by Dr. Rogers.  He reviewed the results of your lab work which are normal/stable.   We will see you back as needed. There is no need to schedule follow up in our clinic at this time.      Thank you for choosing [facility] at [facility] to provide your oncology and hematology care.  To afford each patient quality time with our provider, please arrive at least 15 minutes before your scheduled appointment time.   If you have a lab appointment with the Cancer Center please come in thru the Main Entrance and check in at the main information desk.  You need to re-schedule your appointment should you arrive 10 or more minutes late.  We strive to give you quality time with our providers, and arriving late affects you and other patients whose appointments are after yours.  Also, if you no show three or more times for appointments you may be dismissed from the clinic at the providers discretion.     Again, thank you for choosing [facility].  Our hope is that these requests will decrease the amount of time that you wait before being seen by our physicians.       _____________________________________________________________  Should you have questions after your visit to [facility], please contact our office at [phone] and follow the prompts.  Our office hours are 8:00 a.m. and 4:30 p.m. Monday - Friday.  Please note that voicemails left after 4:00 p.m. may not be returned until the following business day.  We are closed weekends and major holidays.  You do have access to a nurse 24-7, just call the main number to the clinic [phone] and do not press any options, hold on the line and a nurse will answer the phone.    For prescription refill requests, have your pharmacy contact our office and allow 72 hours.    Due to Covid, you will  need to wear a mask upon entering the hospital. If you do not have a mask, a mask will be given to you at the Main Entrance upon arrival. For doctor visits, patients may have 1 support person age 71 or older with them. For treatment visits, patients can not have anyone with them due to social distancing guidelines and our immunocompromised population.

## 2023-09-27 LAB — MISC LABCORP TEST (SEND OUT): Labcorp test code: 452322

## 2023-10-05 DIAGNOSIS — Z79899 Other long term (current) drug therapy: Secondary | ICD-10-CM | POA: Diagnosis not present

## 2023-10-05 DIAGNOSIS — M51362 Other intervertebral disc degeneration, lumbar region with discogenic back pain and lower extremity pain: Secondary | ICD-10-CM | POA: Diagnosis not present

## 2023-10-05 DIAGNOSIS — M542 Cervicalgia: Secondary | ICD-10-CM | POA: Diagnosis not present

## 2024-01-03 ENCOUNTER — Telehealth: Payer: Self-pay | Admitting: Family Medicine

## 2024-01-03 DIAGNOSIS — R7303 Prediabetes: Secondary | ICD-10-CM

## 2024-01-03 DIAGNOSIS — E039 Hypothyroidism, unspecified: Secondary | ICD-10-CM

## 2024-01-03 DIAGNOSIS — E7800 Pure hypercholesterolemia, unspecified: Secondary | ICD-10-CM

## 2024-01-03 DIAGNOSIS — R7989 Other specified abnormal findings of blood chemistry: Secondary | ICD-10-CM

## 2024-01-03 DIAGNOSIS — I10 Essential (primary) hypertension: Secondary | ICD-10-CM

## 2024-01-03 NOTE — Telephone Encounter (Signed)
 Orders placed.

## 2024-01-03 NOTE — Telephone Encounter (Signed)
 Patient has appt 11-17 for labs and needs orders in from Dettinger.Has physical 11-21.

## 2024-01-06 ENCOUNTER — Other Ambulatory Visit: Payer: Self-pay

## 2024-01-06 DIAGNOSIS — I10 Essential (primary) hypertension: Secondary | ICD-10-CM | POA: Diagnosis not present

## 2024-01-06 DIAGNOSIS — R7303 Prediabetes: Secondary | ICD-10-CM

## 2024-01-06 DIAGNOSIS — E039 Hypothyroidism, unspecified: Secondary | ICD-10-CM

## 2024-01-06 DIAGNOSIS — E7800 Pure hypercholesterolemia, unspecified: Secondary | ICD-10-CM

## 2024-01-06 DIAGNOSIS — R7989 Other specified abnormal findings of blood chemistry: Secondary | ICD-10-CM | POA: Diagnosis not present

## 2024-01-06 LAB — BAYER DCA HB A1C WAIVED: HB A1C (BAYER DCA - WAIVED): 5.8 % — ABNORMAL HIGH (ref 4.8–5.6)

## 2024-01-07 LAB — CBC WITH DIFFERENTIAL/PLATELET
Basophils Absolute: 0 x10E3/uL (ref 0.0–0.2)
Basos: 1 %
EOS (ABSOLUTE): 0.1 x10E3/uL (ref 0.0–0.4)
Eos: 3 %
Hematocrit: 40.7 % (ref 34.0–46.6)
Hemoglobin: 13.2 g/dL (ref 11.1–15.9)
Immature Grans (Abs): 0 x10E3/uL (ref 0.0–0.1)
Immature Granulocytes: 0 %
Lymphocytes Absolute: 2.2 x10E3/uL (ref 0.7–3.1)
Lymphs: 52 %
MCH: 30.4 pg (ref 26.6–33.0)
MCHC: 32.4 g/dL (ref 31.5–35.7)
MCV: 94 fL (ref 79–97)
Monocytes Absolute: 0.4 x10E3/uL (ref 0.1–0.9)
Monocytes: 10 %
Neutrophils Absolute: 1.5 x10E3/uL (ref 1.4–7.0)
Neutrophils: 34 %
Platelets: 243 x10E3/uL (ref 150–450)
RBC: 4.34 x10E6/uL (ref 3.77–5.28)
RDW: 12.3 % (ref 11.7–15.4)
WBC: 4.3 x10E3/uL (ref 3.4–10.8)

## 2024-01-07 LAB — CMP14+EGFR
ALT: 10 IU/L (ref 0–32)
AST: 19 IU/L (ref 0–40)
Albumin: 4 g/dL (ref 3.8–4.8)
Alkaline Phosphatase: 61 IU/L (ref 49–135)
BUN/Creatinine Ratio: 25 (ref 12–28)
BUN: 16 mg/dL (ref 8–27)
Bilirubin Total: 0.6 mg/dL (ref 0.0–1.2)
CO2: 23 mmol/L (ref 20–29)
Calcium: 9.8 mg/dL (ref 8.7–10.3)
Chloride: 103 mmol/L (ref 96–106)
Creatinine, Ser: 0.63 mg/dL (ref 0.57–1.00)
Globulin, Total: 3 g/dL (ref 1.5–4.5)
Glucose: 95 mg/dL (ref 70–99)
Potassium: 3.9 mmol/L (ref 3.5–5.2)
Sodium: 142 mmol/L (ref 134–144)
Total Protein: 7 g/dL (ref 6.0–8.5)
eGFR: 92 mL/min/1.73 (ref >59)

## 2024-01-07 LAB — LIPID PANEL
Chol/HDL Ratio: 2.7 ratio (ref 0.0–4.4)
Cholesterol, Total: 174 mg/dL (ref 100–199)
HDL: 64 mg/dL (ref >39)
LDL Chol Calc (NIH): 95 mg/dL (ref 0–99)
Triglycerides: 81 mg/dL (ref 0–149)
VLDL Cholesterol Cal: 15 mg/dL (ref 5–40)

## 2024-01-07 LAB — TSH: TSH: 1.77 u[IU]/mL (ref 0.450–4.500)

## 2024-01-09 ENCOUNTER — Ambulatory Visit: Payer: Self-pay | Admitting: Family Medicine

## 2024-01-10 ENCOUNTER — Encounter: Payer: Self-pay | Admitting: Family Medicine

## 2024-01-10 ENCOUNTER — Ambulatory Visit: Admitting: Family Medicine

## 2024-01-10 ENCOUNTER — Ambulatory Visit: Payer: Self-pay | Admitting: Family Medicine

## 2024-01-10 VITALS — BP 115/75 | HR 81 | Temp 97.7°F | Ht 68.0 in | Wt 162.4 lb

## 2024-01-10 DIAGNOSIS — E039 Hypothyroidism, unspecified: Secondary | ICD-10-CM | POA: Diagnosis not present

## 2024-01-10 DIAGNOSIS — I10 Essential (primary) hypertension: Secondary | ICD-10-CM

## 2024-01-10 DIAGNOSIS — Z0000 Encounter for general adult medical examination without abnormal findings: Secondary | ICD-10-CM

## 2024-01-10 DIAGNOSIS — E7800 Pure hypercholesterolemia, unspecified: Secondary | ICD-10-CM | POA: Diagnosis not present

## 2024-01-10 DIAGNOSIS — R7303 Prediabetes: Secondary | ICD-10-CM

## 2024-01-10 DIAGNOSIS — Z780 Asymptomatic menopausal state: Secondary | ICD-10-CM | POA: Diagnosis not present

## 2024-01-10 DIAGNOSIS — Z0001 Encounter for general adult medical examination with abnormal findings: Secondary | ICD-10-CM

## 2024-01-10 MED ORDER — HYDROCHLOROTHIAZIDE 12.5 MG PO TABS: 12.5000 mg | ORAL_TABLET | Freq: Every day | ORAL | 3 refills | Status: AC

## 2024-01-10 MED ORDER — LEVOTHYROXINE SODIUM 50 MCG PO TABS: 50.0000 ug | ORAL_TABLET | Freq: Every day | ORAL | 3 refills | Status: AC

## 2024-01-10 NOTE — Progress Notes (Signed)
 BP 115/75   Pulse 81   Temp 97.7 F (36.5 C)   Ht 5' 8 (1.727 m)   Wt 162 lb 6.4 oz (73.7 kg)   SpO2 97%   BMI 24.69 kg/m    Subjective:   Patient ID: Theresa Gamble, female    DOB: 20-Dec-1945, 78 y.o.   MRN: [id number]  HPI: Theresa Gamble is a 78 y.o. female presenting on 01/10/2024 for Medical Management of Chronic Issues   Discussed the use of AI scribe software for clinical note transcription with the patient, who gave verbal consent to proceed.  History of Present Illness   Theresa Gamble is a 78 year old female who presents for an annual physical exam.  General health status - Overall health is stable without new complaints - Preparing for Thanksgiving and planning to cook a turkey - Caring for a three-year-old great grandchild, which she finds exciting  Thyroid  disorder management - Thyroid  condition managed with levothyroxine  - Levothyroxine  is effective with no reported issues  Diabetes risk and glycemic control - Recent hemoglobin A1c is 5.8 - Attributes A1c to not eating at night, though sometimes experiences nocturnal hunger  Preventive health maintenance - Compliant with mammogram screening  Laboratory review - Recent blood work reviewed, including kidney function, blood sugar, liver function, thyroid  levels, cholesterol, hemoglobin, white blood cell count, and platelets          Relevant past medical, surgical, family and social history reviewed and updated as indicated. Interim medical history since our last visit reviewed. Allergies and medications reviewed and updated.  Review of Systems  Constitutional:  Negative for chills and fever.  HENT:  Negative for ear discharge.   Eyes:  Negative for visual disturbance.  Respiratory:  Negative for chest tightness and shortness of breath.   Cardiovascular:  Negative for chest pain and leg swelling.  Genitourinary:  Negative for difficulty urinating and dysuria.  Musculoskeletal:  Negative for back pain  and gait problem.  Skin:  Negative for rash.  Neurological:  Negative for dizziness, light-headedness and headaches.  Psychiatric/Behavioral:  Negative for agitation and behavioral problems.   All other systems reviewed and are negative.   Per HPI unless specifically indicated above   Allergies as of 01/10/2024       Reactions   Latex Rash        Medication List        Accurate as of January 10, 2024  8:22 AM. If you have any questions, ask your nurse or doctor.          aspirin EC 81 MG tablet Take 81 mg by mouth daily.   calcium carbonate 1500 (600 Ca) MG Tabs tablet Commonly known as: OSCAL Take 600 mg of elemental calcium by mouth daily with breakfast.   hydrochlorothiazide  12.5 MG tablet Commonly known as: HYDRODIURIL  Take 1 tablet (12.5 mg total) by mouth daily.   levothyroxine  50 MCG tablet Commonly known as: SYNTHROID  Take 1 tablet (50 mcg total) by mouth daily.   multivitamin tablet Take 1 tablet by mouth daily.   Ultram 50 MG tablet Generic drug: traMADol Take 1 tablet 3 times a day by oral route as needed for pain.         Objective:   BP 115/75   Pulse 81   Temp 97.7 F (36.5 C)   Ht 5' 8 (1.727 m)   Wt 162 lb 6.4 oz (73.7 kg)   SpO2 97%   BMI 24.69 kg/m   Wt Readings  from Last 3 Encounters:  01/10/24 162 lb 6.4 oz (73.7 kg)  09/18/23 158 lb 1.1 oz (71.7 kg)  08/28/23 157 lb (71.2 kg)    Physical Exam Physical Exam   HEENT: Normal swallowing. NECK: Thyroid  without nodules. CHEST: Lungs clear to auscultation. CARDIOVASCULAR: Regular heart sounds. BREAST: Breasts without swelling.       Results for orders placed or performed in visit on 01/06/24  Bayer DCA Hb A1c Waived   Collection Time: 01/06/24  8:28 AM  Result Value Ref Range   HB A1C (BAYER DCA - WAIVED) 5.8 (H) 4.8 - 5.6 %  TSH   Collection Time: 01/06/24  8:31 AM  Result Value Ref Range   TSH 1.770 0.450 - 4.500 uIU/mL  CMP14+EGFR   Collection Time: 01/06/24   8:31 AM  Result Value Ref Range   Glucose 95 70 - 99 mg/dL   BUN 16 8 - 27 mg/dL   Creatinine, Ser 9.36 0.57 - 1.00 mg/dL   eGFR 92 >40 fO/fpw/8.26   BUN/Creatinine Ratio 25 12 - 28   Sodium 142 134 - 144 mmol/L   Potassium 3.9 3.5 - 5.2 mmol/L   Chloride 103 96 - 106 mmol/L   CO2 23 20 - 29 mmol/L   Calcium 9.8 8.7 - 10.3 mg/dL   Total Protein 7.0 6.0 - 8.5 g/dL   Albumin 4.0 3.8 - 4.8 g/dL   Globulin, Total 3.0 1.5 - 4.5 g/dL   Bilirubin Total 0.6 0.0 - 1.2 mg/dL   Alkaline Phosphatase 61 49 - 135 IU/L   AST 19 0 - 40 IU/L   ALT 10 0 - 32 IU/L  Lipid panel   Collection Time: 01/06/24  8:31 AM  Result Value Ref Range   Cholesterol, Total 174 100 - 199 mg/dL   Triglycerides 81 0 - 149 mg/dL   HDL 64 >60 mg/dL   VLDL Cholesterol Cal 15 5 - 40 mg/dL   LDL Chol Calc (NIH) 95 0 - 99 mg/dL   Chol/HDL Ratio 2.7 0.0 - 4.4 ratio  CBC with Differential/Platelet   Collection Time: 01/06/24  8:31 AM  Result Value Ref Range   WBC 4.3 3.4 - 10.8 x10E3/uL   RBC 4.34 3.77 - 5.28 x10E6/uL   Hemoglobin 13.2 11.1 - 15.9 g/dL   Hematocrit 59.2 65.9 - 46.6 %   MCV 94 79 - 97 fL   MCH 30.4 26.6 - 33.0 pg   MCHC 32.4 31.5 - 35.7 g/dL   RDW 87.6 88.2 - 84.5 %   Platelets 243 150 - 450 x10E3/uL   Neutrophils 34 Not Estab. %   Lymphs 52 Not Estab. %   Monocytes 10 Not Estab. %   Eos 3 Not Estab. %   Basos 1 Not Estab. %   Neutrophils Absolute 1.5 1.4 - 7.0 x10E3/uL   Lymphocytes Absolute 2.2 0.7 - 3.1 x10E3/uL   Monocytes Absolute 0.4 0.1 - 0.9 x10E3/uL   EOS (ABSOLUTE) 0.1 0.0 - 0.4 x10E3/uL   Basophils Absolute 0.0 0.0 - 0.2 x10E3/uL   Immature Granulocytes 0 Not Estab. %   Immature Grans (Abs) 0.0 0.0 - 0.1 x10E3/uL    Assessment & Plan:   Problem List Items Addressed This Visit       Cardiovascular and Mediastinum   Hypertension   Relevant Medications   hydrochlorothiazide  (HYDRODIURIL ) 12.5 MG tablet   Other Relevant Orders   CBC With Diff/Platelet   CMP14+EGFR      Endocrine   Hypothyroidism   Relevant  Medications   levothyroxine  (SYNTHROID ) 50 MCG tablet   Other Relevant Orders   CBC With Diff/Platelet   TSH     Other   Prediabetes   Relevant Orders   Bayer DCA Hb A1c Waived   CBC With Diff/Platelet   Hyperlipidemia   Relevant Medications   hydrochlorothiazide  (HYDRODIURIL ) 12.5 MG tablet   Other Relevant Orders   Lipid panel   Other Visit Diagnoses       Physical exam    -  Primary     Postmenopausal       Relevant Orders   DG [facility] DEXA          Adult Wellness Visit Routine wellness visit with normal lab results and unremarkable physical exam. - Continue current health maintenance regimen. - Scheduled follow-up in six months.  Essential hypertension Blood pressure is well-controlled with current management.  Hypothyroidism Thyroid  function tests are within normal limits. Current treatment with levothyroxine  is effective. - Continue levothyroxine  as prescribed.  Pure hypercholesterolemia Cholesterol levels are well-controlled with current management.  Prediabetes A1c is 5.8, indicating good glycemic control. Discussed dietary habits, particularly the importance of including protein in meals to manage hunger and blood sugar levels. - Continue current dietary habits with emphasis on protein intake.          Follow up plan: Return in about 6 months (around 07/09/2024), or if symptoms worsen or fail to improve, for Recheck thyroid .  Counseling provided for all of the vaccine components Orders Placed This Encounter  Procedures   DG [facility] DEXA   Bayer DCA Hb A1c Waived   CBC With Diff/Platelet   CMP14+EGFR   Lipid panel   TSH    Fonda Levins, MD [facility] 01/10/2024, 8:22 AM

## 2024-01-14 ENCOUNTER — Ambulatory Visit (INDEPENDENT_AMBULATORY_CARE_PROVIDER_SITE_OTHER): Admitting: Nurse Practitioner

## 2024-01-14 ENCOUNTER — Ambulatory Visit: Payer: Self-pay

## 2024-01-14 ENCOUNTER — Encounter: Payer: Self-pay | Admitting: Nurse Practitioner

## 2024-01-14 VITALS — BP 140/80 | HR 101 | Temp 97.6°F | Ht 68.0 in | Wt 162.0 lb

## 2024-01-14 DIAGNOSIS — N3000 Acute cystitis without hematuria: Secondary | ICD-10-CM | POA: Diagnosis not present

## 2024-01-14 DIAGNOSIS — R300 Dysuria: Secondary | ICD-10-CM | POA: Diagnosis not present

## 2024-01-14 LAB — MICROSCOPIC EXAMINATION
Epithelial Cells (non renal): NONE SEEN /HPF (ref 0–10)
RBC, Urine: >30 /HPF — AB (ref 0–2)
Renal Epithel, UA: NONE SEEN /HPF
WBC, UA: >30 /HPF — AB (ref 0–5)

## 2024-01-14 LAB — URINALYSIS, ROUTINE W REFLEX MICROSCOPIC
Bilirubin, UA: NEGATIVE
Glucose, UA: NEGATIVE
Ketones, UA: NEGATIVE
Nitrite, UA: POSITIVE — AB
Specific Gravity, UA: 1.02 (ref 1.005–1.030)
Urobilinogen, Ur: 0.2 mg/dL (ref 0.2–1.0)
pH, UA: 6 (ref 5.0–7.5)

## 2024-01-14 MED ORDER — SULFAMETHOXAZOLE-TRIMETHOPRIM 800-160 MG PO TABS: 1.0000 | ORAL_TABLET | Freq: Two times a day (BID) | ORAL | 0 refills | Status: AC

## 2024-01-14 NOTE — Patient Instructions (Signed)
 Take medication as prescribe Cotton underwear Take shower not bath Cranberry juice, yogurt Force fluids AZO over the counter X2 days Culture pending RTO prn

## 2024-01-14 NOTE — Progress Notes (Signed)
   Subjective:    Patient ID: Theresa Gamble, female    DOB: 11/22/1945, 78 y.o.   MRN: [id number]   Chief Complaint: Urinary Frequency   Urinary Frequency  This is a new problem. The current episode started in the past 7 days. The problem occurs intermittently. The problem has been waxing and waning. The quality of the pain is described as burning (suprapubic pressure). The pain is at a severity of 9/10. The pain is severe. There has been no fever. She is Not sexually active. There is No history of pyelonephritis. Associated symptoms include frequency, hesitancy and urgency. She has tried nothing for the symptoms. The treatment provided mild relief.    Patient Active Problem List   Diagnosis Date Noted   Leukopenia 09/18/2023   Hyperlipidemia 01/09/2023   Hypothyroidism 01/08/2022   Lumbar radiculopathy 06/16/2020   Prediabetes 07/02/2019   Hypertension 11/07/2018   Right thyroid  nodule 08/06/2017   Chronic allergic rhinitis 06/15/2016       Review of Systems  Genitourinary:  Positive for dysuria, frequency, hesitancy and urgency.       Objective:   Physical Exam Constitutional:      Appearance: Normal appearance.  Cardiovascular:     Rate and Rhythm: Normal rate and regular rhythm.     Heart sounds: Normal heart sounds.  Pulmonary:     Effort: Pulmonary effort is normal.     Breath sounds: Normal breath sounds.  Skin:    General: Skin is warm.  Neurological:     General: No focal deficit present.     Mental Status: She is alert and oriented to person, place, and time.  Psychiatric:        Mood and Affect: Mood normal.        Behavior: Behavior normal.     BP (!) 140/80   Pulse (!) 101   Temp 97.6 F (36.4 C) (Temporal)   Ht 5' 8 (1.727 m)   Wt 162 lb (73.5 kg)   SpO2 98%   BMI 24.63 kg/m        Assessment & Plan:   Theresa Gamble in today with chief complaint of Urinary Frequency   1. Dysuria (Primary) - Urinalysis, Routine w reflex  microscopic - Urine Culture  2. Acute cystitis without hematuria Take medication as prescribe Cotton underwear Take shower not bath Cranberry juice, yogurt Force fluids AZO over the counter X2 days Culture pending RTO prn  - sulfamethoxazole -trimethoprim  (BACTRIM  DS) 800-160 MG tablet; Take 1 tablet by mouth 2 (two) times daily.  Dispense: 20 tablet; Refill: 0    The above assessment and management plan was discussed with the patient. The patient verbalized understanding of and has agreed to the management plan. Patient is aware to call the clinic if symptoms persist or worsen. Patient is aware when to return to the clinic for a follow-up visit. Patient educated on when it is appropriate to go to the emergency department.   Mary-Margaret Gladis, FNP

## 2024-01-14 NOTE — Telephone Encounter (Signed)
 FYI Only or Action Required?: Action required by provider: request for appointment.  Patient was last seen in primary care on 01/10/2024 by Dettinger, Fonda LABOR, MD.  Called Nurse Triage reporting urinary symptoms.  Symptoms began several days ago.  Interventions attempted: Nothing.  Symptoms are: gradually worsening.Worsening urinary symptoms.  Triage Disposition: See Physician Within 24 Hours  Patient/caregiver understands and will follow disposition?: Yes     Copied from CRM #[id number]. Topic: Clinical - Red Word Triage >> Jan 14, 2024  7:46 AM Emylou G wrote: [facility] that prompted transfer to Nurse Triage: pressure when she pees and can barely go pee - worsening every day since the weekend.. Answer Assessment - Initial Assessment Questions 1. SYMPTOM: What's the main symptom you're concerned about? (e.g., frequency, incontinence)     Not emptying, pressure 2. ONSET: When did the    start?     weekend 3. PAIN: Is there any pain? If Yes, ask: How bad is it? (Scale: 1-10; mild, moderate, severe)     8 4. CAUSE: What do you think is causing the symptoms?     UTI 5. OTHER SYMPTOMS: Do you have any other symptoms? (e.g., blood in urine, fever, flank pain, pain with urination)     NO 6. PREGNANCY: Is there any chance you are pregnant? When was your last menstrual period?     NO  Protocols used: Urinary Symptoms-A-AH  Reason for Disposition  Urinating more frequently than usual (i.e., frequency) OR new-onset of the feeling of an urgent need to urinate (i.e., urgency)  Answer Assessment - Initial Assessment Questions 1. SYMPTOM: What's the main symptom you're concerned about? (e.g., frequency, incontinence)     Not emptying, pressure 2. ONSET: When did the    start?     weekend 3. PAIN: Is there any pain? If Yes, ask: How bad is it? (Scale: 1-10; mild, moderate, severe)     8 4. CAUSE: What do you think is causing the symptoms?     UTI 5. OTHER  SYMPTOMS: Do you have any other symptoms? (e.g., blood in urine, fever, flank pain, pain with urination)     NO 6. PREGNANCY: Is there any chance you are pregnant? When was your last menstrual period?     NO  Protocols used: Urinary Symptoms-A-AH

## 2024-01-18 LAB — URINE CULTURE

## 2024-01-20 ENCOUNTER — Ambulatory Visit: Payer: Self-pay | Admitting: Nurse Practitioner

## 2024-01-20 MED ORDER — CIPROFLOXACIN HCL 500 MG PO TABS: 500.0000 mg | ORAL_TABLET | Freq: Two times a day (BID) | ORAL | 0 refills | Status: AC

## 2024-02-07 ENCOUNTER — Other Ambulatory Visit: Payer: Self-pay | Admitting: Family Medicine

## 2024-02-07 DIAGNOSIS — Z1231 Encounter for screening mammogram for malignant neoplasm of breast: Secondary | ICD-10-CM

## 2024-02-10 ENCOUNTER — Inpatient Hospital Stay
Admission: RE | Admit: 2024-02-10 | Discharge: 2024-02-10 | Disposition: A | Source: Ambulatory Visit | Attending: Family Medicine

## 2024-02-10 ENCOUNTER — Ambulatory Visit (INDEPENDENT_AMBULATORY_CARE_PROVIDER_SITE_OTHER)

## 2024-02-10 DIAGNOSIS — Z780 Asymptomatic menopausal state: Secondary | ICD-10-CM | POA: Diagnosis not present

## 2024-02-10 DIAGNOSIS — Z1231 Encounter for screening mammogram for malignant neoplasm of breast: Secondary | ICD-10-CM

## 2024-02-21 ENCOUNTER — Ambulatory Visit: Payer: Self-pay | Admitting: Family Medicine

## 2024-02-28 ENCOUNTER — Ambulatory Visit: Payer: PPO

## 2024-02-28 VITALS — BP 140/80 | HR 101 | Ht 68.0 in | Wt 162.0 lb

## 2024-02-28 DIAGNOSIS — Z0000 Encounter for general adult medical examination without abnormal findings: Secondary | ICD-10-CM | POA: Diagnosis not present

## 2024-02-28 NOTE — Patient Instructions (Signed)
 Theresa Gamble,  Thank you for taking the time for your Medicare Wellness Visit. I appreciate your continued commitment to your health goals. Please review the care plan we discussed, and feel free to reach out if I can assist you further.  Please note that Annual Wellness Visits do not include a physical exam. Some assessments may be limited, especially if the visit was conducted virtually. If needed, we may recommend an in-person follow-up with your provider.  Ongoing Care Seeing your primary care provider every 3 to 6 months helps us  monitor your health and provide consistent, personalized care.   Referrals If a referral was made during today's visit and you haven't received any updates within two weeks, please contact the referred provider directly to check on the status.  Recommended Screenings:  Health Maintenance  Topic Date Due   COVID-19 Vaccine (6 - 2025-26 season) 10/21/2023   Medicare Annual Wellness Visit  02/27/2024   Flu Shot  05/19/2024*   Osteoporosis screening with Bone Density Scan  02/09/2026   DTaP/Tdap/Td vaccine (3 - Td or Tdap) 12/31/2032   Pneumococcal Vaccine for age over 65  Completed   Zoster (Shingles) Vaccine  Completed   Meningitis B Vaccine  Aged Out   Breast Cancer Screening  Discontinued   Colon Cancer Screening  Discontinued   Hepatitis C Screening  Discontinued  *Topic was postponed. The date shown is not the original due date.       02/28/2024   10:17 AM  Advanced Directives  Does Patient Have a Medical Advance Directive? No  Would patient like information on creating a medical advance directive? No - Patient declined    Vision: Annual vision screenings are recommended for early detection of glaucoma, cataracts, and diabetic retinopathy. These exams can also reveal signs of chronic conditions such as diabetes and high blood pressure.  Dental: Annual dental screenings help detect early signs of oral cancer, gum disease, and other conditions  linked to overall health, including heart disease and diabetes.  Please see the attached documents for additional preventive care recommendations.

## 2024-02-28 NOTE — Progress Notes (Signed)
 "  Chief Complaint  Patient presents with   Medicare Wellness     Subjective:   Theresa Gamble is a 79 y.o. female who presents for a Medicare Annual Wellness Visit.  Visit info / Clinical Intake: Medicare Wellness Visit Type:: Subsequent Annual Wellness Visit Persons participating in visit and providing information:: patient Medicare Wellness Visit Mode:: Telephone If telephone:: video declined Since this visit was completed virtually, some vitals may be partially provided or unavailable. Missing vitals are due to the limitations of the virtual format.: Unable to obtain vitals - no equipment If Telephone or Video please confirm:: I connected with patient using audio/video enable telemedicine. I verified patient identity with two identifiers, discussed telehealth limitations, and patient agreed to proceed. Patient Location:: home Provider Location:: office Interpreter Needed?: No Pre-visit prep was completed: yes AWV questionnaire completed by patient prior to visit?: no Living arrangements:: lives with spouse/significant other Patient's Overall Health Status Rating: very good Typical amount of pain: none Does pain affect daily life?: no Are you currently prescribed opioids?: no  Dietary Habits and Nutritional Risks How many meals a day?: 3 Eats fruit and vegetables daily?: yes Most meals are obtained by: preparing own meals In the last 2 weeks, have you had any of the following?: none Diabetic:: no  Functional Status Activities of Daily Living (to include ambulation/medication): Independent Ambulation: Independent Medication Administration: Independent Home Management (perform basic housework or laundry): Independent Manage your own finances?: yes Primary transportation is: driving Concerns about vision?: no *vision screening is required for WT[facility]* (last ov 5/25/[facility]) Concerns about hearing?: (!) yes Uses hearing aids?: (!) yes  Fall Screening Falls in  the past year?: 0 Number of falls in past year: 0 Was there an injury with Fall?: 0 Fall Risk Category Calculator: 0 Patient Fall Risk Level: Low Fall Risk  Fall Risk Patient at Risk for Falls Due to: No Fall Risks Fall risk Follow up: Falls evaluation completed; Education provided  Home and Transportation Safety: All rugs have non-skid backing?: yes All stairs or steps have railings?: yes Grab bars in the bathtub or shower?: yes Have non-skid surface in bathtub or shower?: yes Good home lighting?: yes Regular seat belt use?: yes Hospital stays in the last year:: no  Cognitive Assessment Difficulty concentrating, remembering, or making decisions? : no Will 6CIT or Mini Cog be Completed: yes What year is it?: 0 points What month is it?: 0 points Give patient an address phrase to remember (5 components): [address] [place] what time is it?: 0 points Count backwards from 20 to 1: 0 points Say the months of the year in reverse: 0 points Repeat the address phrase from earlier: 0 points 6 CIT Score: 0 points  Advance Directives (For Healthcare) Does Patient Have a Medical Advance Directive?: No Would patient like information on creating a medical advance directive?: No - Patient declined  Reviewed/Updated  Reviewed/Updated: Reviewed All (Medical, Surgical, Family, Medications, Allergies, Care Teams, Patient Goals); Medical History; Surgical History; Family History; Medications; Allergies; Care Teams; Patient Goals    Allergies (verified) Latex   Current Medications (verified) Outpatient Encounter Medications as of 02/28/2024  Medication Sig   aspirin EC 81 MG tablet Take 81 mg by mouth daily.   calcium carbonate (OSCAL) 1500 (600 Ca) MG TABS tablet Take 600 mg of elemental calcium by mouth daily with breakfast.   hydrochlorothiazide  (HYDRODIURIL ) 12.5 MG tablet Take 1 tablet (12.5 mg total) by mouth daily.   levothyroxine  (SYNTHROID ) 50  MCG tablet Take 1  tablet (50 mcg total) by mouth daily.   Multiple Vitamin (MULTIVITAMIN) tablet Take 1 tablet by mouth daily.   ULTRAM 50 MG tablet Take 1 tablet 3 times a day by oral route as needed for pain.   sulfamethoxazole -trimethoprim  (BACTRIM  DS) 800-160 MG tablet Take 1 tablet by mouth 2 (two) times daily. (Patient not taking: Reported on 02/28/2024)   No facility-administered encounter medications on file as of 02/28/2024.    History: Past Medical History:  Diagnosis Date   Allergy    seasonal allergies   Arthritis    back   Cataract    bilateral sx   Hypertension    Past Surgical History:  Procedure Laterality Date   ABDOMINAL HYSTERECTOMY     BREAST BIOPSY     CHOLECYSTECTOMY     EYE SURGERY Bilateral    cataract removal   Family History  Problem Relation Age of Onset   Heart disease Mother    Diabetes Mother    Stroke Father    Colon cancer Neg Hx    Colon polyps Neg Hx    Esophageal cancer Neg Hx    Rectal cancer Neg Hx    Stomach cancer Neg Hx    Breast cancer Neg Hx    Social History   Occupational History   Occupation: retired    Associate Professor: [employer]    Comment: 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  Tobacco Use   Smoking status: Never   Smokeless tobacco: Never  Vaping Use   Vaping status: Never Used  Substance and Sexual Activity   Alcohol use: Never   Drug use: Never   Sexual activity: Yes    Birth control/protection: Surgical   Tobacco Counseling Counseling given: Yes  SDOH Screenings   Food Insecurity: No Food Insecurity (02/28/2024)  Housing: Low Risk (02/28/2024)  Transportation Needs: No Transportation Needs (02/28/2024)  Utilities: Not At Risk (02/28/2024)  Alcohol Screen: Low Risk (02/27/2023)  Depression (PHQ2-9): Low Risk (02/28/2024)  Financial Resource Strain: Low Risk (02/27/2023)  Physical Activity: Insufficiently Active (02/28/2024)  Social Connections: Moderately Integrated (02/28/2024)  Stress: No Stress Concern Present (02/28/2024)  Tobacco Use: Low Risk (02/28/2024)   Health Literacy: Adequate Health Literacy (02/28/2024)   See flowsheets for full screening details  Depression Screen PHQ 2 & 9 Depression Scale- Over the past 2 weeks, how often have you been bothered by any of the following problems? Little interest or pleasure in doing things: 0 Feeling down, depressed, or hopeless (PHQ Adolescent also includes...irritable): 0 PHQ-2 Total Score: 0 Trouble falling or staying asleep, or sleeping too much: 0 Feeling tired or having little energy: 0 Poor appetite or overeating (PHQ Adolescent also includes...weight loss): 0 Feeling bad about yourself - or that you are a failure or have let yourself or your family down: 0 Trouble concentrating on things, such as reading the newspaper or watching television (PHQ Adolescent also includes...like school work): 0 Moving or speaking so slowly that other people could have noticed. Or the opposite - being so fidgety or restless that you have been moving around a lot more than usual: 0 Thoughts that you would be better off dead, or of hurting yourself in some way: 0 PHQ-9 Total Score: 0 If you checked off any problems, how difficult have these problems made it for you to do your work, take care of things at home, or get along with other people?: Not difficult at all     Goals Addressed  This Visit's Progress    Remain active and independent   On track            Objective:    Today's Vitals   02/28/24 1013  BP: (!) 140/80  Pulse: (!) 101  Weight: 162 lb (73.5 kg)  Height: 5' 8 (1.727 m)   Body mass index is 24.63 kg/m.  Hearing/Vision screen No results found. Immunizations and Health Maintenance Health Maintenance  Topic Date Due   COVID-19 Vaccine (6 - 2025-26 season) 10/21/2023   Influenza Vaccine  05/19/2024 (Originally 09/20/2023)   Medicare Annual Wellness (AWV)  02/27/2025   Bone Density Scan  02/09/2026   DTaP/Tdap/Td (3 - Td or Tdap) 12/31/2032   Pneumococcal Vaccine:  50+ Years  Completed   Zoster Vaccines- Shingrix  Completed   Meningococcal B Vaccine  Aged Out   Mammogram  Discontinued   Colonoscopy  Discontinued   Hepatitis C Screening  Discontinued        Assessment/Plan:  This is a routine wellness examination for [place].  Patient Care Team: Dettinger, Fonda LABOR, MD as PCP - General (Family Medicine) Bonner Ade, MD as Consulting Physician (Physical Medicine and Rehabilitation) [facility]  I have personally reviewed and noted the following in the patients chart:   Medical and social history Use of alcohol, tobacco or illicit drugs  Current medications and supplements including opioid prescriptions. Functional ability and status Nutritional status Physical activity Advanced directives List of other physicians Hospitalizations, surgeries, and ER visits in previous 12 months Vitals Screenings to include cognitive, depression, and falls Referrals and appointments  No orders of the defined types were placed in this encounter.  In addition, I have reviewed and discussed with patient certain preventive protocols, quality metrics, and best practice recommendations. A written personalized care plan for preventive services as well as general preventive health recommendations were provided to patient.   Theresa Gamble, CMA   02/28/2024   Return in 1 year (on 02/27/2025).  After Visit Summary: (MyChart) Due to this being a telephonic visit, the after visit summary with patients personalized plan was offered to patient via MyChart   Nurse Notes: n/a "

## 2024-07-06 ENCOUNTER — Other Ambulatory Visit

## 2024-07-10 ENCOUNTER — Ambulatory Visit: Admitting: Family Medicine

## 2025-02-22 ENCOUNTER — Ambulatory Visit

## 2025-03-01 ENCOUNTER — Ambulatory Visit
